# Patient Record
Sex: Female | Born: 1950 | Race: White | Hispanic: No | Marital: Single | Smoking: Never smoker
Health system: Southern US, Community
[De-identification: ages and names within clinical notes are randomized; demographics above are authoritative.]

## PROBLEM LIST (undated history)

## (undated) ENCOUNTER — Emergency Department: Payer: Medicare Other

## (undated) DIAGNOSIS — Z8742 Personal history of other diseases of the female genital tract: Secondary | ICD-10-CM

## (undated) DIAGNOSIS — K219 Gastro-esophageal reflux disease without esophagitis: Secondary | ICD-10-CM

## (undated) DIAGNOSIS — M1990 Unspecified osteoarthritis, unspecified site: Secondary | ICD-10-CM

## (undated) DIAGNOSIS — F32A Depression, unspecified: Secondary | ICD-10-CM

## (undated) DIAGNOSIS — G2581 Restless legs syndrome: Secondary | ICD-10-CM

## (undated) HISTORY — PX: FRACTURE SURGERY: SHX138

## (undated) HISTORY — PX: SHOULDER SURGERY: SHX246

---

## 2006-01-18 ENCOUNTER — Ambulatory Visit: Payer: Self-pay | Admitting: Internal Medicine

## 2008-01-30 ENCOUNTER — Ambulatory Visit: Payer: Self-pay | Admitting: Internal Medicine

## 2008-07-31 ENCOUNTER — Ambulatory Visit: Payer: Self-pay | Admitting: Unknown Physician Specialty

## 2010-03-17 ENCOUNTER — Ambulatory Visit: Payer: Self-pay | Admitting: Internal Medicine

## 2010-03-27 ENCOUNTER — Ambulatory Visit: Payer: Self-pay

## 2010-04-18 ENCOUNTER — Other Ambulatory Visit: Payer: Self-pay | Admitting: Internal Medicine

## 2011-04-05 ENCOUNTER — Emergency Department: Payer: Self-pay | Admitting: Emergency Medicine

## 2011-04-14 ENCOUNTER — Ambulatory Visit: Payer: Self-pay | Admitting: Internal Medicine

## 2014-07-23 ENCOUNTER — Ambulatory Visit: Payer: Self-pay

## 2014-08-07 ENCOUNTER — Ambulatory Visit: Payer: Self-pay | Admitting: Family Medicine

## 2015-07-23 ENCOUNTER — Other Ambulatory Visit: Payer: Self-pay | Admitting: Internal Medicine

## 2015-07-23 ENCOUNTER — Ambulatory Visit
Admission: RE | Admit: 2015-07-23 | Discharge: 2015-07-23 | Disposition: A | Discharge status: Home / Self Care | Payer: BLUE CROSS/BLUE SHIELD | Source: Ambulatory Visit | Attending: Internal Medicine | Admitting: Internal Medicine

## 2015-07-23 DIAGNOSIS — Z1231 Encounter for screening mammogram for malignant neoplasm of breast: Secondary | ICD-10-CM | POA: Diagnosis present

## 2015-07-24 ENCOUNTER — Other Ambulatory Visit: Payer: Self-pay | Admitting: Internal Medicine

## 2015-07-24 DIAGNOSIS — Z1231 Encounter for screening mammogram for malignant neoplasm of breast: Secondary | ICD-10-CM

## 2017-07-29 ENCOUNTER — Other Ambulatory Visit: Payer: Self-pay | Admitting: Internal Medicine

## 2017-07-29 DIAGNOSIS — Z1239 Encounter for other screening for malignant neoplasm of breast: Secondary | ICD-10-CM

## 2017-07-29 DIAGNOSIS — Z0000 Encounter for general adult medical examination without abnormal findings: Secondary | ICD-10-CM

## 2017-08-10 ENCOUNTER — Ambulatory Visit: Payer: BLUE CROSS/BLUE SHIELD

## 2018-08-02 ENCOUNTER — Other Ambulatory Visit: Payer: Self-pay | Admitting: Internal Medicine

## 2018-08-02 DIAGNOSIS — Z1231 Encounter for screening mammogram for malignant neoplasm of breast: Secondary | ICD-10-CM

## 2018-11-12 ENCOUNTER — Ambulatory Visit
Admission: EM | Admit: 2018-11-12 | Discharge: 2018-11-12 | Disposition: A | Discharge status: Home / Self Care | Payer: Medicare Other | Attending: Emergency Medicine | Admitting: Emergency Medicine

## 2018-11-12 ENCOUNTER — Encounter: Payer: Self-pay | Admitting: Emergency Medicine

## 2018-11-12 ENCOUNTER — Other Ambulatory Visit: Payer: Self-pay

## 2018-11-12 ENCOUNTER — Ambulatory Visit: Payer: Medicare Other

## 2018-11-12 DIAGNOSIS — Y92007 Garden or yard of unspecified non-institutional (private) residence as the place of occurrence of the external cause: Secondary | ICD-10-CM | POA: Diagnosis not present

## 2018-11-12 DIAGNOSIS — W010XXA Fall on same level from slipping, tripping and stumbling without subsequent striking against object, initial encounter: Secondary | ICD-10-CM | POA: Diagnosis not present

## 2018-11-12 DIAGNOSIS — M25531 Pain in right wrist: Secondary | ICD-10-CM | POA: Insufficient documentation

## 2018-11-12 HISTORY — DX: Gastro-esophageal reflux disease without esophagitis: K21.9

## 2018-11-12 NOTE — Discharge Instructions (Signed)
Over-the-counter Tylenol or ibuprofen as needed.  Ice and elevate.  Wear splint for the next 3 to 4 days and gradual increase activity as tolerated.  Follow-up with orthopedic in 1 week as needed for continued pain.  Follow up with your primary care physician this week as needed. Return to Urgent care for new or worsening concerns.

## 2018-11-12 NOTE — ED Triage Notes (Signed)
Patient c/o right wrist pain after she fell and landed on her right wrist today.

## 2018-11-12 NOTE — ED Provider Notes (Signed)
[facility] ____________________________________________  Time seen: Approximately 4:06 PM  I have reviewed the triage vital signs and the nursing notes.   HISTORY  Chief Complaint Wrist Pain (right)   HPI Meredith Norris is a 68 y.o. female presenting for evaluation of right wrist pain after injury that occurred this afternoon.  Patient states she was working in her yard and pulling a [employer] line out of a bush.  States while she was pulling hard the binding broke way causing her to fall back.  States caught her self with her right wrist.  States that she felt the pain in her wrist more during the pool injury than the fall.  Denies head injury or other injuries.  Right-hand-dominant.  Has applied ice since.  Denies other alleviating measures.  Pain worse with activity.  Denies recent cough, chest pain, shortness of breath or fevers.  Reports otherwise doing well.  Mickey Farberhies, David, MD: PCP    Past Medical History:  Diagnosis Date  . GERD (gastroesophageal reflux disease)     There are no active problems to display for this patient.   Past Surgical History:  Procedure Laterality Date  . FRACTURE SURGERY    . SHOULDER SURGERY Left      No current facility-administered medications for this encounter.   Current Outpatient Medications:  .  fluticasone (FLONASE) 50 MCG/ACT nasal spray, Place into the nose., Disp: , Rfl:  .  pantoprazole (PROTONIX) 40 MG tablet, Take by mouth., Disp: , Rfl:  .  sertraline (ZOLOFT) 100 MG tablet, Take by mouth., Disp: , Rfl:  .  Cholecalciferol (VITAMIN D-1000 MAX ST) 25 MCG (1000 UT) tablet, Take by mouth., Disp: , Rfl:  .  Glucosamine-Chondroit-Vit C-Mn (GLUCOSAMINE CHONDROITIN COMPLX) TABS, Take by mouth., Disp: , Rfl:  .  Omega-3 1000 MG CAPS, Take by mouth., Disp: , Rfl:  .  vitamin B-12 (CYANOCOBALAMIN) 1000 MCG tablet, Take by mouth., Disp: , Rfl:   Allergies Patient has no known allergies.  Family History  Problem  Relation Age of Onset  . Breast cancer Cousin        mat cousin  . Hypertension Mother     Social History Social History   Tobacco Use  . Smoking status: Never Smoker  . Smokeless tobacco: Never Used  Substance Use Topics  . Alcohol use: Never    Frequency: Never  . Drug use: Never    Review of Systems Constitutional: No fever Cardiovascular: Denies chest pain. Respiratory: Denies shortness of breath. Gastrointestinal: No abdominal pain.   Musculoskeletal:Positive right wrist pain.  Skin: Negative for rash.   ____________________________________________   PHYSICAL EXAM:  VITAL SIGNS: ED Triage Vitals  Enc Vitals Group     BP 11/12/18 1608 131/72     Pulse Rate 11/12/18 1608 76     Resp 11/12/18 1608 16     Temp 11/12/18 1608 98.1 F (36.7 C)     Temp Source 11/12/18 1608 Oral     SpO2 11/12/18 1608 99 %     Weight 11/12/18 1607 150 lb (68 kg)     Height 11/12/18 1607 5\' 2"  (1.575 m)     Head Circumference --      Peak Flow --      Pain Score 11/12/18 1606 5     Pain Loc --      Pain Edu? --      Excl. in GC? --     Constitutional: Alert and oriented. Well appearing and in no  acute distress. ENT      Head: Normocephalic and atraumatic. Cardiovascular: Normal rate, regular rhythm. Grossly normal heart sounds.  Good peripheral circulation. Respiratory: Normal respiratory effort without tachypnea nor retractions. Breath sounds are clear and equal bilaterally. No wheezes, rales, rhonchi. Musculoskeletal: Steady gait. Bilateral distal radial pulses equal and easily palpated.  Right hand grip strong. Except: Right distal radius tenderness to direct palpation with minimal ecchymosis, mild pain with handgrip full hand grip and good strength, right hand no motor or tendon deficit noted to distal fingers, mild pain with wrist rotation but full range of motion present.  Right upper extremity otherwise nontender. Neurologic:  Normal speech and language.  Skin:  Skin is  warm, dry and intact. No rash noted. Psychiatric: Mood and affect are normal. Speech and behavior are normal. Patient exhibits appropriate insight and judgment   ___________________________________________   LABS (all labs ordered are listed, but only abnormal results are displayed)  Labs Reviewed - No data to display ____________________________________________  RADIOLOGY  Dg Wrist Complete Right  Result Date: 11/12/2018 CLINICAL DATA:  Right wrist pain.  Status post fall. EXAM: RIGHT WRIST - COMPLETE 3+ VIEW COMPARISON:  None. FINDINGS: Generalized osteopenia. No acute fracture or dislocation. No aggressive osseous lesion. 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 No soft tissue abnormality. IMPRESSION: No acute osseous injury of the right wrist. Electronically Signed   By: Elige Ko   On: 11/12/2018 16:28   ____________________________________________   PROCEDURES Procedures     INITIAL IMPRESSION / ASSESSMENT AND PLAN / ED COURSE  Pertinent labs & imaging results that were available during my care of the patient were reviewed by me and considered in my medical decision making (see chart for details).  Well-appearing patient.  No acute distress.  Right wrist pain post mechanical injury that occurred prior to arrival.  Right wrist x-ray as above per radiologist, no acute osseous abnormality.  Suspect sprain and contusion injuries.  Right Velcro cock up splint given for support to use for the next 3 to 4 days.  Ice, over-the-counter Tylenol ibuprofen as needed.  Follow-up with orthopedic in 1 week as needed for continued pain.  Discussed follow up with Primary care physician this week. Discussed follow up and return parameters including no resolution or any worsening concerns. Patient verbalized understanding and agreed to plan.   ____________________________________________   FINAL CLINICAL IMPRESSION(S) / ED DIAGNOSES  Final diagnoses:  Right wrist  pain     ED Discharge Orders    None       Note: This dictation was prepared with Dragon dictation along with smaller phrase technology. Any transcriptional errors that result from this process are unintentional.         Renford Dills, NP 11/12/18 1714

## 2020-12-27 ENCOUNTER — Ambulatory Visit (INDEPENDENT_AMBULATORY_CARE_PROVIDER_SITE_OTHER): Payer: Medicare Other

## 2020-12-27 ENCOUNTER — Other Ambulatory Visit: Payer: Self-pay

## 2020-12-27 ENCOUNTER — Ambulatory Visit
Admission: EM | Admit: 2020-12-27 | Discharge: 2020-12-27 | Disposition: A | Discharge status: Home / Self Care | Payer: Medicare Other | Attending: Medical Oncology | Admitting: Medical Oncology

## 2020-12-27 ENCOUNTER — Encounter: Payer: Self-pay | Admitting: Emergency Medicine

## 2020-12-27 DIAGNOSIS — M545 Low back pain, unspecified: Secondary | ICD-10-CM | POA: Diagnosis present

## 2020-12-27 DIAGNOSIS — R3129 Other microscopic hematuria: Secondary | ICD-10-CM

## 2020-12-27 DIAGNOSIS — R109 Unspecified abdominal pain: Secondary | ICD-10-CM

## 2020-12-27 DIAGNOSIS — R319 Hematuria, unspecified: Secondary | ICD-10-CM

## 2020-12-27 LAB — POCT URINALYSIS DIP (DEVICE)
Bilirubin Urine: NEGATIVE
Glucose, UA: NEGATIVE mg/dL
Ketones, ur: NEGATIVE mg/dL
Leukocytes,Ua: NEGATIVE
Nitrite: NEGATIVE
Protein, ur: NEGATIVE mg/dL
Specific Gravity, Urine: <=1.005 (ref 1.005–1.030)
Urobilinogen, UA: 0.2 mg/dL (ref 0.0–1.0)
pH: 5.5 (ref 5.0–8.0)

## 2020-12-27 NOTE — Discharge Instructions (Addendum)
The urine shows microscopic blood.  We x-rayed you to see if there is any evidence of a kidney stone and there was not any stone seen on the x-ray but as we discussed, they do not see all kidney stones so if your symptoms continue you may follow-up with your primary care provider to see about ordering a CT.  It is most likely though that this is musculoskeletal so I would continue using your device and taking Tylenol/Aleve for pain and also try using warm compresses.  If you have any severe acute worsening of your pain you need to go to the ER.  BACK PAIN: Stressed avoiding painful activities . RICE (REST, ICE, COMPRESSION, ELEVATION) guidelines reviewed. May alternate ice and heat. Consider use of muscle rubs, Salonpas patches, etc. Use medications as directed including muscle relaxers if prescribed. Take anti-inflammatory medications as prescribed or OTC NSAIDs/Tylenol.  F/u with PCP in 7-10 days for reexamination, and please feel free to call or return to the urgent care at any time for any questions or concerns you may have and we will be happy to help you!   BACK PAIN RED FLAGS: If the back pain acutely worsens or there are any red flag symptoms such as numbness/tingling, leg weakness, saddle anesthesia, or loss of bowel/bladder control, go immediately to the ER. Follow up with [place] as scheduled or sooner if the pain does not begin to resolve or if it worsens before the follow up

## 2020-12-27 NOTE — ED Provider Notes (Signed)
[facility]    CSN: [id number] Arrival date & time: 12/27/20  1654      History   Chief Complaint Chief Complaint  Patient presents with   Back Pain    HPI Rheda Kassab is a 70 y.o. female presenting for approximately 1 week history of left lower back pain.  Patient states that it is constant and worse at times.  Pain is worse never she is sitting and improves a little bit when she lies down or stands up.  It does not radiate to the lower extremities and is not associate with any numbness, weakness or tingling.  She denies any sort of injury.  Patient does admit concerns for possible kidney infection since she states that she has had a kidney infection before.  She does however deny any fever, fatigue, chills, dysuria, urinary frequency urgency or hematuria.  She has tried over-the-counter NSAIDs, Tylenol and a pain relieving device similar to a TENS unit.  Patient states the TENS unit helps but the other medications do not.  No other complaints.  HPI  Past Medical History:  Diagnosis Date   GERD (gastroesophageal reflux disease)     There are no problems to display for this patient.   Past Surgical History:  Procedure Laterality Date   FRACTURE SURGERY     SHOULDER SURGERY Left     OB History   No obstetric history on file.      Home Medications    Prior to Admission medications   Medication Sig Start Date End Date Taking? Authorizing Provider  Cholecalciferol 25 MCG (1000 UT) tablet Take by mouth.   Yes [provider]  fluticasone (FLONASE) 50 MCG/ACT nasal spray Place into the nose. 08/02/18  Yes [provider]  Glucosamine-Chondroit-Vit C-Mn (GLUCOSAMINE CHONDROITIN COMPLX) TABS Take by mouth.   Yes [provider]  Omega-3 1000 MG CAPS Take by mouth.   Yes [provider]  pantoprazole (PROTONIX) 40 MG tablet Take by mouth. 08/02/18  Yes [provider]  sertraline (ZOLOFT) 100 MG tablet Take by  mouth. 08/02/18  Yes [provider]  vitamin B-12 (CYANOCOBALAMIN) 1000 MCG tablet Take by mouth.   Yes [provider]  vitamin E 180 MG (400 UNITS) capsule Take by mouth.   Yes [provider]    Family History Family History  Problem Relation Age of Onset   Breast cancer Cousin        mat cousin   Hypertension Mother     Social History Social History   Tobacco Use   Smoking status: Never   Smokeless tobacco: Never  Vaping Use   Vaping Use: Never used  Substance Use Topics   Alcohol use: Never   Drug use: Never     Allergies   Ciprofloxacin hcl, Clindamycin, and Triamcinolone acetonide   Review of Systems Review of Systems  Constitutional:  Negative for fatigue and fever.  Gastrointestinal:  Negative for abdominal pain, nausea and vomiting.  Genitourinary:  Negative for difficulty urinating, dysuria, flank pain, frequency and hematuria.  Musculoskeletal:  Positive for back pain.  Skin:  Negative for rash.  Neurological:  Negative for weakness and numbness.    Physical Exam Triage Vital Signs ED Triage Vitals  Enc Vitals Group     BP 12/27/20 1726 (!) 154/79     Pulse Rate 12/27/20 1726 79     Resp 12/27/20 1726 18     Temp 12/27/20 1726 (!) 97.5 F (36.4 C)  Temp Source 12/27/20 1726 Oral     SpO2 12/27/20 1726 98 %     Weight 12/27/20 1723 149 lb 14.6 oz (68 kg)     Height 12/27/20 1723 5\' 2"  (1.575 m)     Head Circumference --      Peak Flow --      Pain Score 12/27/20 1722 5     Pain Loc --      Pain Edu? --      Excl. in GC? --    No data found.  Updated Vital Signs BP (!) 154/79 (BP Location: Left Arm)   Pulse 79   Temp (!) 97.5 F (36.4 C) (Oral)   Resp 18   Ht 5\' 2"  (1.575 m)   Wt 149 lb 14.6 oz (68 kg)   SpO2 98%   BMI 27.42 kg/m      Physical Exam Vitals and nursing note reviewed.  Constitutional:      General: She is not in acute distress.    Appearance: Normal appearance. She is not  ill-appearing or toxic-appearing.  HENT:     Head: Normocephalic and atraumatic.     Nose: Nose normal.     Mouth/Throat:     Mouth: Mucous membranes are moist.     Pharynx: Oropharynx is clear.  Eyes:     General: No scleral icterus.       Right eye: No discharge.        Left eye: No discharge.     Conjunctiva/sclera: Conjunctivae normal.  Cardiovascular:     Rate and Rhythm: Normal rate and regular rhythm.     Heart sounds: Normal heart sounds.  Pulmonary:     Effort: Pulmonary effort is normal. No respiratory distress.     Breath sounds: Normal breath sounds.  Abdominal:     Palpations: Abdomen is soft.     Tenderness: There is no abdominal tenderness. There is left CVA tenderness (mild).  Musculoskeletal:     Cervical back: Neck supple.     Comments: Back: Tenderness to palpation of the left paralumbar muscles.  No spinal tenderness.  Full range of motion of back.  She does have some discomfort with full extension.  5 out of 5 strength bilateral lower extremities.  Negative straight leg bilaterally  Skin:    General: Skin is dry.  Neurological:     General: No focal deficit present.     Mental Status: She is alert. Mental status is at baseline.     Motor: No weakness.     Gait: Gait normal.  Psychiatric:        Mood and Affect: Mood normal.        Behavior: Behavior normal.        Thought Content: Thought content normal.     UC Treatments / Results  Labs (all labs ordered are listed, but only abnormal results are displayed) Labs Reviewed  POCT URINALYSIS DIP (DEVICE) - Abnormal; Notable for the following components:      Result Value   Hgb urine dipstick TRACE (*)    All other components within normal limits  URINE CULTURE  URINALYSIS, ROUTINE W REFLEX MICROSCOPIC    EKG   Radiology DG Abdomen 1 View  Result Date: 12/27/2020 CLINICAL DATA:  Left flank pain with hematuria EXAM: ABDOMEN - 1 VIEW COMPARISON:  None. FINDINGS: Nonobstructed gas pattern with large  amount of stool in the colon. No radiopaque calculi over the kidneys. IMPRESSION: Negative.  Large amount of stool  in the colon. Electronically Signed   By: Jasmine Pang M.D.   On: 12/27/2020 18:39    Procedures Procedures (including critical care time)  Medications Ordered in UC Medications - No data to display  Initial Impression / Assessment and Plan / UC Course  I have reviewed the triage vital signs and the nursing notes.  Pertinent labs & imaging results that were available during my care of the patient were reviewed by me and considered in my medical decision making (see chart for details).  70 year old female presenting for left-sided lower back pain x1 week.  Pain worse with sitting.  Pain is improved by use of TENS unit.  Concern for possible kidney infection.  BP is little elevated at 154/79.  Patient is overall well-appearing and she does have tenderness of the left paralumbar region and questionable mild left CVA tenderness.  Urinalysis does show trace hemoglobin.  No concern for infection.  KUB ordered to assess for possible renal stone.  KUB does not not show any stone but does show large amount of stool burden.  Reviewed this with patient.  Advised this is likely musculoskeletal but renal stone cannot be ruled out given the flank pain and hematuria.  Advised patient if symptoms continues she may need to see her PCP where they can consider ordering a CT stone protocol to assess for possible renal stone.  I did offer her a different anti-inflammatory medication and a muscle relaxer but she declines.  Reviewed ED precautions for back pain with patient.  Final Clinical Impressions(s) / UC Diagnoses   Final diagnoses:  Acute left-sided low back pain without sciatica  Other microscopic hematuria     Discharge Instructions      The urine shows microscopic blood.  We x-rayed you to see if there is any evidence of a kidney stone and there was not any stone seen on the  x-ray but as we discussed, they do not see all kidney stones so if your symptoms continue you may follow-up with your primary care provider to see about ordering a CT.  It is most likely though that this is musculoskeletal so I would continue using your device and taking Tylenol/Aleve for pain and also try using warm compresses.  If you have any severe acute worsening of your pain you need to go to the ER.  BACK PAIN: Stressed avoiding painful activities . RICE (REST, ICE, COMPRESSION, ELEVATION) guidelines reviewed. May alternate ice and heat. Consider use of muscle rubs, Salonpas patches, etc. Use medications as directed including muscle relaxers if prescribed. Take anti-inflammatory medications as prescribed or OTC NSAIDs/Tylenol.  F/u with PCP in 7-10 days for reexamination, and please feel free to call or return to the urgent care at any time for any questions or concerns you may have and we will be happy to help you!   BACK PAIN RED FLAGS: If the back pain acutely worsens or there are any red flag symptoms such as numbness/tingling, leg weakness, saddle anesthesia, or loss of bowel/bladder control, go immediately to the ER. Follow up with [place] as scheduled or sooner if the pain does not begin to resolve or if it worsens before the follow up       ED Prescriptions   None    PDMP not reviewed this encounter.   Shirlee Latch, PA-C 12/27/20 [phone]

## 2020-12-27 NOTE — ED Triage Notes (Signed)
Pt c/o left sided lower back pain. Started about a week ago. She denies dysuria, fever. Denies injury.

## 2020-12-29 LAB — URINE CULTURE: Culture: <10000 — AB

## 2021-01-22 ENCOUNTER — Emergency Department: Payer: Medicare Other

## 2021-01-22 ENCOUNTER — Other Ambulatory Visit: Payer: Self-pay

## 2021-01-22 ENCOUNTER — Encounter: Payer: Self-pay | Admitting: Emergency Medicine

## 2021-01-22 ENCOUNTER — Emergency Department
Admission: EM | Admit: 2021-01-22 | Discharge: 2021-01-22 | Disposition: A | Discharge status: Home / Self Care | Payer: Medicare Other | Attending: Emergency Medicine | Admitting: Emergency Medicine

## 2021-01-22 DIAGNOSIS — K381 Appendicular concretions: Secondary | ICD-10-CM | POA: Diagnosis not present

## 2021-01-22 DIAGNOSIS — K5730 Diverticulosis of large intestine without perforation or abscess without bleeding: Secondary | ICD-10-CM | POA: Diagnosis not present

## 2021-01-22 DIAGNOSIS — R109 Unspecified abdominal pain: Secondary | ICD-10-CM | POA: Diagnosis present

## 2021-01-22 DIAGNOSIS — K5901 Slow transit constipation: Secondary | ICD-10-CM | POA: Insufficient documentation

## 2021-01-22 DIAGNOSIS — K389 Disease of appendix, unspecified: Secondary | ICD-10-CM

## 2021-01-22 DIAGNOSIS — K5710 Diverticulosis of small intestine without perforation or abscess without bleeding: Secondary | ICD-10-CM

## 2021-01-22 LAB — CBC
HCT: 36.9 % (ref 36.0–46.0)
Hemoglobin: 12.8 g/dL (ref 12.0–15.0)
MCH: 30.9 pg (ref 26.0–34.0)
MCHC: 34.7 g/dL (ref 30.0–36.0)
MCV: 89.1 fL (ref 80.0–100.0)
Platelets: 296 10*3/uL (ref 150–400)
RBC: 4.14 MIL/uL (ref 3.87–5.11)
RDW: 13.2 % (ref 11.5–15.5)
WBC: 5.9 10*3/uL (ref 4.0–10.5)
nRBC: 0 % (ref 0.0–0.2)

## 2021-01-22 LAB — COMPREHENSIVE METABOLIC PANEL
ALT: 15 U/L (ref 0–44)
AST: 22 U/L (ref 15–41)
Albumin: 3.6 g/dL (ref 3.5–5.0)
Alkaline Phosphatase: 90 U/L (ref 38–126)
Anion gap: 9 (ref 5–15)
BUN: 8 mg/dL (ref 8–23)
CO2: 23 mmol/L (ref 22–32)
Calcium: 9.2 mg/dL (ref 8.9–10.3)
Chloride: 101 mmol/L (ref 98–111)
Creatinine, Ser: 0.72 mg/dL (ref 0.44–1.00)
GFR, Estimated: >60 mL/min (ref >60)
Glucose, Bld: 97 mg/dL (ref 70–99)
Potassium: 4.1 mmol/L (ref 3.5–5.1)
Sodium: 133 mmol/L — ABNORMAL LOW (ref 135–145)
Total Bilirubin: 0.6 mg/dL (ref 0.3–1.2)
Total Protein: 6.8 g/dL (ref 6.5–8.1)

## 2021-01-22 LAB — URINALYSIS, COMPLETE (UACMP) WITH MICROSCOPIC
Bacteria, UA: NONE SEEN
Bilirubin Urine: NEGATIVE
Glucose, UA: NEGATIVE mg/dL
Hgb urine dipstick: NEGATIVE
Ketones, ur: NEGATIVE mg/dL
Leukocytes,Ua: NEGATIVE
Nitrite: NEGATIVE
Protein, ur: NEGATIVE mg/dL
Specific Gravity, Urine: 1.002 — ABNORMAL LOW (ref 1.005–1.030)
Squamous Epithelial / HPF: NONE SEEN (ref 0–5)
WBC, UA: NONE SEEN WBC/hpf (ref 0–5)
pH: 7 (ref 5.0–8.0)

## 2021-01-22 LAB — LIPASE, BLOOD: Lipase: 42 U/L (ref 11–51)

## 2021-01-22 MED ORDER — LACTULOSE 10 GM/15ML PO SOLN: ORAL | 0 refills | Status: AC

## 2021-01-22 MED ORDER — DOCUSATE SODIUM 250 MG PO CAPS: 250.0000 mg | ORAL_CAPSULE | Freq: Every day | ORAL | 0 refills | Status: AC

## 2021-01-22 NOTE — ED Triage Notes (Signed)
Pt to ED via POV. Pt seen at urgent care on 7/8 for constipation. Pt states that she has still not had a normal bowel movement since then. Pt states that she is taking OTC medications without relief. Pt states that she is able to pass stool but that it is either a really small amount or it is loose stool. Pt states that she is concerned that she has a bowel blockage. Pt is able to pass gas. Pt is in NAD.

## 2021-01-22 NOTE — Discharge Instructions (Addendum)
Take the stool softener daily, to prevent constipation.  You can start this now.  With the lactulose, I would take this up to 3 times daily until bowel movements are soft/liquid, then cut back.  Repeat as needed.  Follow-up with your GI physician.  You should discuss your CT results to see if they want to do an upper endoscopy as well.  Continue your prescribed antacid.  You could increase the dose to twice a day if you continue to have reflux.  You do have a hiatal hernia noted on your scan.

## 2021-01-22 NOTE — ED Notes (Signed)
First Nurse Note: Pt to ED via POV for constipation. Pt is in NAD.

## 2021-01-22 NOTE — ED Notes (Signed)
Patient called and says they do not have 250 mg colace as ordered in prescription.  Spoke with Dr. Erma Heritage and he tried to change to 100 mg dose and send to [facility] but could not.  I just called in rx for ducosate 100 mg once daily with quantity of 30 and no refills.

## 2021-01-22 NOTE — ED Provider Notes (Signed)
[facility] Emergency Department Provider Note  ____________________________________________   Event Date/Time   First MD Initiated Contact with Patient 01/22/21 1042     (approximate)  I have reviewed the triage vital signs and the nursing notes.   HISTORY  Chief Complaint Constipation    HPI Meredith Norris is a 70 y.o. female with past medical history of GERD here with abdominal pain and intermittent distention.  Patient states that for the last several weeks, she has had difficulty having regular bowel movements.  She had some left flank pain at the beginning of July and was seen in urgent care at that time.  She was told that she was constipated, and was told to take a bowel regimen.  Since then, she feels like she is been having difficulty going to the bathroom.  She has had decreased bowel movement frequency as well as sensation that she needed to go but could not produce more than 1 or 2 small balls of stool.  She has felt distended intermittently but not persistently.  She said no nausea or vomiting.  She has had some decreased appetite.  Denies any weight changes.  No night sweats.  Denies any blood in her stools.  She has been taking MiraLAX intermittently as well as increasing fiber in her diet without significant relief.  No specific worsening factors.  No history of constipation.  She is due for colonoscopy in September.  Denies history of abnormal colonoscopies.  Denies changes in her stool prior to the last month.    Past Medical History:  Diagnosis Date   GERD (gastroesophageal reflux disease)     There are no problems to display for this patient.   Past Surgical History:  Procedure Laterality Date   FRACTURE SURGERY     SHOULDER SURGERY Left     Prior to Admission medications   Medication Sig Start Date End Date Taking? Authorizing Provider  docusate sodium (COLACE) 250 MG capsule Take 1 capsule (250 mg total) by mouth daily.  01/22/21 02/21/21 Yes Shaune Pollack, MD  lactulose (CHRONULAC) 10 GM/15ML solution Take 30 mL by mouth up to three times daily for constipation. Start with one dose, repeat up to 2 additional times daily (every 6-8 hr) until bowel movements are soft/liquid, then cut back. Repeat as needed 01/22/21  Yes Shaune Pollack, MD  Cholecalciferol 25 MCG (1000 UT) tablet Take by mouth.    [provider]  fluticasone (FLONASE) 50 MCG/ACT nasal spray Place into the nose. 08/02/18   [provider]  Glucosamine-Chondroit-Vit C-Mn (GLUCOSAMINE CHONDROITIN COMPLX) TABS Take by mouth.    [provider]  Omega-3 1000 MG CAPS Take by mouth.    [provider]  pantoprazole (PROTONIX) 40 MG tablet Take by mouth. 08/02/18   [provider]  sertraline (ZOLOFT) 100 MG tablet Take by mouth. 08/02/18   [provider]  vitamin B-12 (CYANOCOBALAMIN) 1000 MCG tablet Take by mouth.    [provider]  vitamin E 180 MG (400 UNITS) capsule Take by mouth.    [provider]    Allergies Ciprofloxacin hcl, Clindamycin, and Triamcinolone acetonide  Family History  Problem Relation Age of Onset   Breast cancer Cousin        mat cousin   Hypertension Mother     Social History Social History   Tobacco Use   Smoking status: Never   Smokeless tobacco: Never  Vaping Use   Vaping Use: Never used  Substance  Use Topics   Alcohol use: Never   Drug use: Never    Review of Systems  Review of Systems  Constitutional:  Positive for fatigue. Negative for fever.  HENT:  Negative for congestion and sore throat.   Eyes:  Negative for visual disturbance.  Respiratory:  Negative for cough and shortness of breath.   Cardiovascular:  Negative for chest pain.  Gastrointestinal:  Positive for abdominal distention and constipation. Negative for abdominal pain, diarrhea, nausea and vomiting.  Genitourinary:  Negative for flank pain.  Musculoskeletal:  Negative  for back pain and neck pain.  Skin:  Negative for rash and wound.  Neurological:  Negative for weakness.  All other systems reviewed and are negative.   ____________________________________________  PHYSICAL EXAM:      VITAL SIGNS: ED Triage Vitals  Enc Vitals Group     BP 01/22/21 0954 136/78     Pulse Rate 01/22/21 0954 69     Resp 01/22/21 0954 18     Temp 01/22/21 0954 98.1 F (36.7 C)     Temp Source 01/22/21 0954 Oral     SpO2 01/22/21 0954 97 %     Weight 01/22/21 0955 115 lb (52.2 kg)     Height 01/22/21 0955 5\' 2"  (1.575 m)     Head Circumference --      Peak Flow --      Pain Score 01/22/21 0958 0     Pain Loc --      Pain Edu? --      Excl. in GC? --      Physical Exam Vitals and nursing note reviewed.  Constitutional:      General: She is not in acute distress.    Appearance: She is well-developed.  HENT:     Head: Normocephalic and atraumatic.  Eyes:     Conjunctiva/sclera: Conjunctivae normal.  Cardiovascular:     Rate and Rhythm: Normal rate and regular rhythm.     Heart sounds: Normal heart sounds. No murmur heard.   No friction rub.  Pulmonary:     Effort: Pulmonary effort is normal. No respiratory distress.     Breath sounds: Normal breath sounds. No wheezing or rales.  Abdominal:     General: Abdomen is flat. Bowel sounds are normal. There is no distension.     Palpations: Abdomen is soft.     Tenderness: There is no abdominal tenderness.  Musculoskeletal:     Cervical back: Neck supple.  Skin:    General: Skin is warm.     Capillary Refill: Capillary refill takes less than 2 seconds.  Neurological:     Mental Status: She is alert and oriented to person, place, and time.     Motor: No abnormal muscle tone.      ____________________________________________   LABS (all labs ordered are listed, but only abnormal results are displayed)  Labs Reviewed  COMPREHENSIVE METABOLIC PANEL - Abnormal; Notable for the following components:       Result Value   Sodium 133 (*)    All other components within normal limits  URINALYSIS, COMPLETE (UACMP) WITH MICROSCOPIC - Abnormal; Notable for the following components:   Color, Urine YELLOW (*)    APPearance HAZY (*)    Specific Gravity, Urine 1.002 (*)    All other components within normal limits  LIPASE, BLOOD  CBC    ____________________________________________  EKG:  ________________________________________  RADIOLOGY All imaging, including plain films, CT scans, and ultrasounds, independently reviewed by me, and  interpretations confirmed via formal radiology reads.  ED MD interpretation:   CT abdomen/pelvis: No acute abnormality, probable small hiatal hernia, moderate stool burden, large duodenal diverticulum  Official radiology report(s): CT ABDOMEN PELVIS WO CONTRAST  Result Date: 01/22/2021 CLINICAL DATA:  Abdominal distension, abdominal pain. EXAM: CT ABDOMEN AND PELVIS WITHOUT CONTRAST TECHNIQUE: Multidetector CT imaging of the abdomen and pelvis was performed following the standard protocol without IV contrast. COMPARISON:  None. FINDINGS: Lower chest: Lung bases are clear. Hepatobiliary: Tiny hypodensity at the hepatic dome is likely an incidental finding. No suspicious hepatic lesions. Normal appearance of the gallbladder. Pancreas: Unremarkable. No pancreatic ductal dilatation or surrounding inflammatory changes. Spleen: Normal in size without focal abnormality. Adrenals/Urinary Tract: Normal adrenal glands. Horseshoe kidney configuration. Mild fullness of the right renal pelvis. No significant hydronephrosis. Mild fullness of the left renal pelvis. No definite renal calculi. Normal appearance of the urinary bladder. Stomach/Bowel: Colonic diverticula without acute inflammation. Elongated hyperdense structure in the right lower quadrant of the abdomen measures up to 2.1 cm. Etiology of this dense structure is uncertain but could represent for an appendicolith. Moderate  amount of stool in the colon. Wall thickening involving the distal esophagus. Probable small hiatal hernia. Large air-fluid level near the pancreatic head and most compatible with a large duodenal diverticulum measuring to 3.9 cm. Large calcification adjacent to this duodenal diverticulum that measures up to 1.2 cm. No evidence for bowel obstruction. Vascular/Lymphatic: Atherosclerotic calcifications in the abdominal aorta without aneurysm. No lymph node enlargement in the abdomen or pelvis. Reproductive: Limited evaluation but uterus appears to be present. No evidence for an adnexal mass. Other: Negative for free air. Negative for ascites. Tiny umbilical hernia containing fat. Musculoskeletal: Degenerative facet disease in the lumbar spine. No acute bone abnormality. IMPRESSION: 1. No acute abnormality in the abdomen or pelvis. 2. Probable small hiatal hernia with mild wall thickening in the distal esophagus. Esophagitis cannot be excluded. 3. Moderate stool burden. Prominent hyperdense or calcified structure in the right lower quadrant of the abdomen. Not clear if this is within small bowel loop or related to an appendicolith. This area is poorly characterized. 4. 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 Large calcification adjacent to the duodenum diverticulum. Exact location of this upper abdominal calcification is uncertain. No significant biliary dilatation but limited evaluation on this study without intravascular contrast. 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 6.  Aortic Atherosclerosis (ICD10-I70.0). Electronically Signed   By: Richarda Overlie M.D.   On: 01/22/2021 12:17    ____________________________________________  PROCEDURES   Procedure(s) performed (including Critical Care):  Procedures  ____________________________________________  INITIAL IMPRESSION / MDM / ASSESSMENT AND PLAN / ED COURSE  As part of my medical decision making, I reviewed the following data within  the electronic MEDICAL RECORD NUMBER Nursing notes reviewed and incorporated, Old chart reviewed, Notes from prior ED visits, and [place] Controlled Substance Database       *Jadi Deyarmin was evaluated in Emergency Department on 01/22/2021 for the symptoms described in the history of present illness. She was evaluated in the context of the global COVID-19 pandemic, which necessitated consideration that the patient might be at risk for infection with the SARS-CoV-2 virus that causes COVID-19. Institutional protocols and algorithms that pertain to the evaluation of patients at risk for COVID-19 are in a state of rapid change based on information released by regulatory bodies including the CDC and federal and state organizations. These policies and algorithms were followed during the patient's care in  the ED.  Some ED evaluations and interventions may be delayed as a result of limited staffing during the pandemic.*     Medical Decision Making: 70 year old female here with constipation and intermittent, mild abdominal pain.  No vomiting.  No distentions or symptoms to suggest high-grade obstruction.  Patient is afebrile and hemodynamically stable.  Lab work reviewed and is unremarkable.  No leukocytosis.  No anemia.  CMP with normal LFTs and renal function.  UA without signs of UTI.  Patient sent for CT scan, which was reviewed, shows no evidence of acute abnormality.  She does have moderate stool burden.  Incidental note of duodenal diverticulum. Horseshoe kidney incidentally noted.   Will start on bowel regimen with softeners, lactulose, and refer her to GI for possible EGD in addition to upcoming colonoscopy. Will d/c with outpt follow-up as needed.  ____________________________________________  FINAL CLINICAL IMPRESSION(S) / ED DIAGNOSES  Final diagnoses:  Slow transit constipation  Duodenal diverticulum  Appendicolith     MEDICATIONS GIVEN DURING THIS VISIT:  Medications - No data to  display   ED Discharge Orders          Ordered    lactulose (CHRONULAC) 10 GM/15ML solution        01/22/21 1242    docusate sodium (COLACE) 250 MG capsule  Daily        01/22/21 1242             Note:  This document was prepared using Dragon voice recognition software and may include unintentional dictation errors.   Shaune Pollack, MD 01/22/21 1253

## 2021-01-22 NOTE — ED Notes (Signed)
See triage note. Ambulatory to room. NAD.

## 2021-02-05 ENCOUNTER — Other Ambulatory Visit: Payer: Self-pay

## 2021-02-05 ENCOUNTER — Emergency Department: Payer: Medicare Other

## 2021-02-05 ENCOUNTER — Emergency Department
Admission: EM | Admit: 2021-02-05 | Discharge: 2021-02-05 | Disposition: A | Discharge status: Home / Self Care | Payer: Medicare Other | Attending: Emergency Medicine | Admitting: Emergency Medicine

## 2021-02-05 ENCOUNTER — Encounter: Payer: Self-pay | Admitting: Emergency Medicine

## 2021-02-05 DIAGNOSIS — K5900 Constipation, unspecified: Secondary | ICD-10-CM | POA: Diagnosis not present

## 2021-02-05 NOTE — ED Notes (Signed)
Pt to restroom

## 2021-02-05 NOTE — ED Triage Notes (Signed)
Pt comes into the ED via POV c/o constipation.  Pt states she was seen here 2 weeks ago and completed what the physician said, but she still feels like she is having problems with it.  Pt took dulcolax last night.  Last BM was this morning with some diarrhea.  Pt states she is only able to pass small amounts of stool at a time.

## 2021-02-05 NOTE — ED Provider Notes (Signed)
[facility] Emergency Department Provider Note  ____________________________________________   None    (approximate)  I have reviewed the triage vital signs and the nursing notes.   HISTORY  Chief Complaint Constipation    HPI Meredith Norris is a 70 y.o. female presents emergency department, planing of constipation.  Patient states she was seen here and told she had a lots of stool.  She has been doing what the doctor told her today.  States does not feel that she is passed all of the stool.  States that his stool has become more liquidy with small pieces of solid stool.  Patient is scheduled for routine colonoscopy.  Past Medical History:  Diagnosis Date   GERD (gastroesophageal reflux disease)     There are no problems to display for this patient.   Past Surgical History:  Procedure Laterality Date   FRACTURE SURGERY     SHOULDER SURGERY Left     Prior to Admission medications   Medication Sig Start Date End Date Taking? Authorizing Provider  Cholecalciferol 25 MCG (1000 UT) tablet Take by mouth.    [provider]  docusate sodium (COLACE) 250 MG capsule Take 1 capsule (250 mg total) by mouth daily. 01/22/21 02/21/21  Shaune Pollack, MD  fluticasone (FLONASE) 50 MCG/ACT nasal spray Place into the nose. 08/02/18   [provider]  Glucosamine-Chondroit-Vit C-Mn (GLUCOSAMINE CHONDROITIN COMPLX) TABS Take by mouth.    [provider]  lactulose (CHRONULAC) 10 GM/15ML solution Take 30 mL by mouth up to three times daily for constipation. Start with one dose, repeat up to 2 additional times daily (every 6-8 hr) until bowel movements are soft/liquid, then cut back. Repeat as needed 01/22/21   Shaune Pollack, MD  Omega-3 1000 MG CAPS Take by mouth.    [provider]  pantoprazole (PROTONIX) 40 MG tablet Take by mouth. 08/02/18   [provider]  sertraline (ZOLOFT) 100 MG tablet Take by mouth. 08/02/18    [provider]  vitamin B-12 (CYANOCOBALAMIN) 1000 MCG tablet Take by mouth.    [provider]  vitamin E 180 MG (400 UNITS) capsule Take by mouth.    [provider]    Allergies Ciprofloxacin hcl, Clindamycin, and Triamcinolone acetonide  Family History  Problem Relation Age of Onset   Breast cancer Cousin        mat cousin   Hypertension Mother     Social History Social History   Tobacco Use   Smoking status: Never   Smokeless tobacco: Never  Vaping Use   Vaping Use: Never used  Substance Use Topics   Alcohol use: Never   Drug use: Never    Review of Systems  Constitutional: No fever/chills Eyes: No visual changes. ENT: No sore throat. Respiratory: Denies cough Cardiovascular: Denies chest pain Gastrointestinal: Denies abdominal pain Genitourinary: Negative for dysuria. Musculoskeletal: Negative for back pain. Skin: Negative for rash. Psychiatric: no mood changes,     ____________________________________________   PHYSICAL EXAM:  VITAL SIGNS: ED Triage Vitals  Enc Vitals Group     BP 02/05/21 1011 111/86     Pulse Rate 02/05/21 1011 72     Resp 02/05/21 1011 17     Temp 02/05/21 1011 98.5 F (36.9 C)     Temp Source 02/05/21 1011 Oral     SpO2 02/05/21 1011 97 %     Weight 02/05/21 1012 115 lb 1.3 oz (52.2 kg)     Height 02/05/21 1012  5\' 2"  (1.575 m)     Head Circumference --      Peak Flow --      Pain Score 02/05/21 1012 0     Pain Loc --      Pain Edu? --      Excl. in GC? --     Constitutional: Alert and oriented. Well appearing and in no acute distress. Eyes: Conjunctivae are normal.  Head: Atraumatic. Nose: No congestion/rhinnorhea. Mouth/Throat: Mucous membranes are moist.   Neck:  supple no lymphadenopathy noted Cardiovascular: Normal rate, regular rhythm. Heart sounds are normal Respiratory: Normal respiratory effort.  No retractions, lungs c t a  Abd: soft nontender bs normal all 4 quad GU:  deferred Musculoskeletal: FROM all extremities, warm and well perfused Neurologic:  Normal speech and language.  Skin:  Skin is warm, dry and intact. No rash noted. Psychiatric: Mood and affect are normal. Speech and behavior are normal.  ____________________________________________   LABS (all labs ordered are listed, but only abnormal results are displayed)  Labs Reviewed - No data to display ____________________________________________   ____________________________________________  RADIOLOGY  Abdomen 1 view  ____________________________________________   PROCEDURES  Procedure(s) performed: No  Procedures    ____________________________________________   INITIAL IMPRESSION / ASSESSMENT AND PLAN / ED COURSE  Pertinent labs & imaging results that were available during my care of the patient were reviewed by me and considered in my medical decision making (see chart for details).   Patient is a 70 year old female presents emergency department with concerns of constipation.  See HPI.  Physical exam shows patient to have good bowel sounds and is nontender in the abdomen.  X-ray of the abdomen reviewed by me confirmed by radiology to have no stool burden.  I did explain this to the patient.  Explained to her that she had taken enough laxative that the stool had become more liquidy and thus why she is not passing solid stool.  She is to continue to follow-up with her regular doctor for colonoscopy.  Return emergency department worsening.  She was discharged stable condition.     Meredith Norris was evaluated in Emergency Department on 02/05/2021 for the symptoms described in the history of present illness. She was evaluated in the context of the global COVID-19 pandemic, which necessitated consideration that the patient might be at risk for infection with the SARS-CoV-2 virus that causes COVID-19. Institutional protocols and algorithms that pertain to the evaluation of  patients at risk for COVID-19 are in a state of rapid change based on information released by regulatory bodies including the CDC and federal and state organizations. These policies and algorithms were followed during the patient's care in the ED.    As part of my medical decision making, I reviewed the following data within the electronic MEDICAL RECORD NUMBER Nursing notes reviewed and incorporated, Old chart reviewed, Radiograph reviewed , Notes from prior ED visits, and [place] Controlled Substance Database  ____________________________________________   FINAL CLINICAL IMPRESSION(S) / ED DIAGNOSES  Final diagnoses:  Constipation, unspecified constipation type      NEW MEDICATIONS STARTED DURING THIS VISIT:  New Prescriptions   No medications on file     Note:  This document was prepared using Dragon voice recognition software and may include unintentional dictation errors.    02/07/2021, PA-C 02/05/21 1224    02/07/21, MD 02/06/21 [phone]

## 2021-02-05 NOTE — Discharge Instructions (Addendum)
X-ray does not show a stool burden.  You have been able to pass the majority of the stool.  Please follow-up with your regular doctor for a colonoscopy

## 2021-02-26 ENCOUNTER — Encounter: Payer: Self-pay | Admitting: *Deleted

## 2021-02-27 ENCOUNTER — Encounter: Admission: RE | Payer: Self-pay | Source: Home / Self Care

## 2021-02-27 ENCOUNTER — Ambulatory Visit: Payer: Medicare Other | Admitting: Anesthesiology

## 2021-02-27 ENCOUNTER — Ambulatory Visit: Admission: RE | Admit: 2021-02-27 | Payer: Medicare Other | Source: Home / Self Care

## 2021-02-27 ENCOUNTER — Encounter: Payer: Self-pay | Admitting: *Deleted

## 2021-02-27 ENCOUNTER — Other Ambulatory Visit: Payer: Self-pay

## 2021-02-27 ENCOUNTER — Ambulatory Visit
Admission: RE | Admit: 2021-02-27 | Discharge: 2021-02-27 | Disposition: A | Discharge status: Home / Self Care | Payer: Medicare Other | Attending: Gastroenterology | Admitting: Gastroenterology

## 2021-02-27 ENCOUNTER — Encounter
Admission: RE | Disposition: A | Discharge status: Home / Self Care | Payer: Self-pay | Source: Home / Self Care | Attending: Gastroenterology

## 2021-02-27 DIAGNOSIS — Z1211 Encounter for screening for malignant neoplasm of colon: Secondary | ICD-10-CM | POA: Diagnosis present

## 2021-02-27 DIAGNOSIS — Z888 Allergy status to other drugs, medicaments and biological substances status: Secondary | ICD-10-CM | POA: Insufficient documentation

## 2021-02-27 DIAGNOSIS — K449 Diaphragmatic hernia without obstruction or gangrene: Secondary | ICD-10-CM | POA: Insufficient documentation

## 2021-02-27 DIAGNOSIS — K5730 Diverticulosis of large intestine without perforation or abscess without bleeding: Secondary | ICD-10-CM | POA: Diagnosis not present

## 2021-02-27 DIAGNOSIS — Z79899 Other long term (current) drug therapy: Secondary | ICD-10-CM | POA: Insufficient documentation

## 2021-02-27 DIAGNOSIS — K2100 Gastro-esophageal reflux disease with esophagitis, without bleeding: Secondary | ICD-10-CM | POA: Insufficient documentation

## 2021-02-27 DIAGNOSIS — Z881 Allergy status to other antibiotic agents status: Secondary | ICD-10-CM | POA: Insufficient documentation

## 2021-02-27 DIAGNOSIS — R12 Heartburn: Secondary | ICD-10-CM | POA: Diagnosis not present

## 2021-02-27 DIAGNOSIS — K2970 Gastritis, unspecified, without bleeding: Secondary | ICD-10-CM | POA: Diagnosis not present

## 2021-02-27 DIAGNOSIS — Z791 Long term (current) use of non-steroidal anti-inflammatories (NSAID): Secondary | ICD-10-CM | POA: Diagnosis not present

## 2021-02-27 DIAGNOSIS — K641 Second degree hemorrhoids: Secondary | ICD-10-CM | POA: Insufficient documentation

## 2021-02-27 HISTORY — DX: Personal history of other diseases of the female genital tract: Z87.42

## 2021-02-27 HISTORY — DX: Unspecified osteoarthritis, unspecified site: M19.90

## 2021-02-27 HISTORY — PX: COLONOSCOPY WITH PROPOFOL: SHX5780

## 2021-02-27 HISTORY — PX: ESOPHAGOGASTRODUODENOSCOPY (EGD) WITH PROPOFOL: SHX5813

## 2021-02-27 HISTORY — DX: Depression, unspecified: F32.A

## 2021-02-27 HISTORY — DX: Restless legs syndrome: G25.81

## 2021-02-27 SURGERY — COLONOSCOPY WITH PROPOFOL
Anesthesia: General

## 2021-02-27 MED ORDER — PHENYLEPHRINE HCL (PRESSORS) 10 MG/ML IV SOLN
INTRAVENOUS | Status: DC | PRN
  Administered 2021-02-27 (×2): 50 ug via INTRAVENOUS

## 2021-02-27 MED ORDER — SODIUM CHLORIDE 0.9 % IV SOLN: INTRAVENOUS | Status: DC

## 2021-02-27 MED ORDER — EPHEDRINE SULFATE 50 MG/ML IJ SOLN
INTRAMUSCULAR | Status: DC | PRN
  Administered 2021-02-27: 10 mg via INTRAVENOUS

## 2021-02-27 MED ORDER — PANTOPRAZOLE SODIUM 40 MG PO TBEC: 40.0000 mg | DELAYED_RELEASE_TABLET | Freq: Two times a day (BID) | ORAL | 3 refills | Status: AC

## 2021-02-27 MED ORDER — PANTOPRAZOLE SODIUM 40 MG PO TBEC: 40.0000 mg | DELAYED_RELEASE_TABLET | Freq: Every day | ORAL | 1 refills | Status: AC

## 2021-02-27 MED ORDER — LIDOCAINE HCL (CARDIAC) PF 100 MG/5ML IV SOSY
PREFILLED_SYRINGE | INTRAVENOUS | Status: DC | PRN
  Administered 2021-02-27: 30 mg via INTRAVENOUS

## 2021-02-27 MED ORDER — PROPOFOL 500 MG/50ML IV EMUL
INTRAVENOUS | Status: DC | PRN
  Administered 2021-02-27: 150 ug/kg/min via INTRAVENOUS

## 2021-02-27 NOTE — Anesthesia Postprocedure Evaluation (Signed)
Anesthesia Post Note  Patient: Kashara Blocher  Procedure(s) Performed: COLONOSCOPY WITH PROPOFOL ESOPHAGOGASTRODUODENOSCOPY (EGD) WITH PROPOFOL  Patient location during evaluation: Endoscopy Anesthesia Type: General Level of consciousness: awake and alert Pain management: pain level controlled Vital Signs Assessment: post-procedure vital signs reviewed and stable Respiratory status: spontaneous breathing, nonlabored ventilation and respiratory function stable Cardiovascular status: blood pressure returned to baseline and stable Postop Assessment: no apparent nausea or vomiting Anesthetic complications: no   No notable events documented.   Last Vitals:  Vitals:   02/27/21 0702 02/27/21 0814  BP: 130/68 (!) 97/57  Pulse: 70 91  Resp: 17 16  Temp: 36.7 C (!) 36.1 C  SpO2: 100% 99%    Last Pain:  Vitals:   02/27/21 0844  TempSrc:   PainSc: 0-No pain                 Foye Deer

## 2021-02-27 NOTE — Transfer of Care (Signed)
Immediate Anesthesia Transfer of Care Note  Patient: Meredith Norris  Procedure(s) Performed: COLONOSCOPY WITH PROPOFOL ESOPHAGOGASTRODUODENOSCOPY (EGD) WITH PROPOFOL  Patient Location: PACU  Anesthesia Type:General  Level of Consciousness: awake and sedated  Airway & Oxygen Therapy: Patient Spontanous Breathing and Patient connected to nasal cannula oxygen  Post-op Assessment: Report given to RN and Post -op Vital signs reviewed and stable  Post vital signs: Reviewed and stable  Last Vitals:  Vitals Value Taken Time  BP    Temp    Pulse    Resp    SpO2      Last Pain:  Vitals:   02/27/21 0702  TempSrc: Temporal  PainSc: 0-No pain         Complications: No notable events documented.

## 2021-02-27 NOTE — Interval H&P Note (Signed)
History and Physical Interval Note: Preprocedure H&P from 02/27/21  was reviewed and there was no interval change after seeing and examining the patient.  Written consent was obtained from the patient after discussion of risks, benefits, and alternatives. Patient has consented to proceed with Esophagogastroduodenoscopy and Colonoscopy with possible intervention    02/27/2021 7:35 AM  Meredith Norris  has presented today for surgery, with the diagnosis of abnormal CT Scan of GI tract,CCA SCREEN.  The various methods of treatment have been discussed with the patient and family. After consideration of risks, benefits and other options for treatment, the patient has consented to  Procedure(s): COLONOSCOPY WITH PROPOFOL (N/A) ESOPHAGOGASTRODUODENOSCOPY (EGD) WITH PROPOFOL (N/A) as a surgical intervention.  The patient's history has been reviewed, patient examined, no change in status, stable for surgery.  I have reviewed the patient's chart and labs.  Questions were answered to the patient's satisfaction.     [name]

## 2021-02-27 NOTE — Anesthesia Preprocedure Evaluation (Addendum)
Anesthesia Evaluation  Patient identified by MRN, date of birth, ID band Patient awake    Reviewed: Allergy & Precautions, NPO status , Patient's Chart, lab work & pertinent test results  Airway Mallampati: III  TM Distance: >3 FB Neck ROM: full    Dental no notable dental hx.    Pulmonary neg pulmonary ROS,    Pulmonary exam normal        Cardiovascular Exercise Tolerance: Good negative cardio ROS Normal cardiovascular exam     Neuro/Psych PSYCHIATRIC DISORDERS Depression negative neurological ROS     GI/Hepatic Neg liver ROS, GERD  ,  Endo/Other  negative endocrine ROS  Renal/GU negative Renal ROS  negative genitourinary   Musculoskeletal  (+) Arthritis , Osteoarthritis,    Abdominal Normal abdominal exam  (+)   Peds  Hematology negative hematology ROS (+)   Anesthesia Other Findings Past Medical History: No date: Arthritis No date: Depression No date: GERD (gastroesophageal reflux disease) No date: History of polycystic ovaries No date: Restless leg syndrome  Past Surgical History: No date: FRACTURE SURGERY No date: SHOULDER SURGERY; Left     Reproductive/Obstetrics negative OB ROS                            Anesthesia Physical Anesthesia Plan  ASA: 2  Anesthesia Plan: General   Post-op Pain Management:    Induction:   PONV Risk Score and Plan: Propofol infusion, TIVA and Treatment may vary due to age or medical condition  Airway Management Planned: Nasal Cannula and Natural Airway  Additional Equipment:   Intra-op Plan:   Post-operative Plan:   Informed Consent: I have reviewed the patients History and Physical, chart, labs and discussed the procedure including the risks, benefits and alternatives for the proposed anesthesia with the patient or authorized representative who has indicated his/her understanding and acceptance.     Dental Advisory Given  Plan  Discussed with: Anesthesiologist, CRNA and Surgeon  Anesthesia Plan Comments:         Anesthesia Quick Evaluation

## 2021-02-27 NOTE — H&P (Signed)
[facility] Pre-Procedure H&P   Patient ID: Meredith Norris is a 70 y.o. female.  Gastroenterology Provider: [name], DO  Referring Provider: Vevelyn Pat, NP PCP: [facility]  Date: 02/27/2021  HPI [name] is a 70 y.o. female who presents today for Esophagogastroduodenoscopy and Colonoscopy for GERD (diagnostic EGD) and 10 year screening colonoscopy.  Worsening reflux but now better controlled on protonix 20 mg bid. Bout of constipation has resolved.  Last colonoscopy 2011 with diverticulosis and internal hemorrhoids. EGD at same time demonstrated grade b esophagitis and hiatal hernia.  Patient denies nausea, vomiting, coffee ground emesis, hematemesis, abdominal pain, diarrhea, constipation, melena, hematochezia, fever, chills, dysphagia, odynophagia, jaundice, heartburn/reflux.   Past Medical History:  Diagnosis Date   Arthritis    Depression    GERD (gastroesophageal reflux disease)    History of polycystic ovaries    Restless leg syndrome     Past Surgical History:  Procedure Laterality Date   FRACTURE SURGERY     SHOULDER SURGERY Left     Family History No h/o GI disease or malignancy  Review of Systems  Constitutional:  Negative for activity change, appetite change, fatigue, fever and unexpected weight change.  HENT:  Negative for trouble swallowing and voice change.   Respiratory:  Negative for shortness of breath and wheezing.   Cardiovascular:  Negative for chest pain and palpitations.  Gastrointestinal:  Positive for constipation (resolved). Negative for abdominal distention, abdominal pain, anal bleeding, blood in stool, diarrhea, nausea, rectal pain and vomiting.       Acid reflux  Musculoskeletal:  Negative for arthralgias and myalgias.  Skin:  Negative for color change and pallor.  Neurological:  Negative for dizziness, syncope and weakness.  Psychiatric/Behavioral:  Negative for agitation and  confusion.   All other systems reviewed and are negative.   Medications No current facility-administered medications on file prior to encounter.   Current Outpatient Medications on File Prior to Encounter  Medication Sig Dispense Refill   acetaminophen (TYLENOL) 325 MG tablet Take 650 mg by mouth daily as needed.     Acetylcysteine 600 MG CAPS Take 600 mg by mouth 2 (two) times daily.     azelastine (ASTELIN) 0.1 % nasal spray Place 1 spray into both nostrils 2 (two) times daily. Use in each nostril as directed     BOSWELLIA SERRATA PO Take 500 mg by mouth daily.     Calcium Carb-Cholecalciferol (CALTRATE 600+D3 PO) Take 1,500 mg by mouth daily.     Cholecalciferol 25 MCG (1000 UT) tablet Take by mouth.     Coenzyme Q-10 100 MG capsule Take 100 mg by mouth daily.     fluticasone (FLONASE) 50 MCG/ACT nasal spray Place into the nose.     Glucosamine-Chondroit-Vit C-Mn (GLUCOSAMINE CHONDROITIN COMPLX) TABS Take by mouth.     Ibuprofen 200 MG CAPS Take 1 capsule by mouth daily as needed.     Multiple Vitamin (MULTIVITAMIN) tablet Take 1 tablet by mouth daily.     Multiple Vitamins-Minerals (HAIR/SKIN/NAILS/BIOTIN) TABS Take 1 tablet by mouth daily.     Omega-3 1000 MG CAPS Take by mouth.     pantoprazole (PROTONIX) 40 MG tablet Take by mouth.     sertraline (ZOLOFT) 100 MG tablet Take by mouth.     vitamin B-12 (CYANOCOBALAMIN) 1000 MCG tablet Take by mouth.     vitamin C (ASCORBIC ACID) 500 MG tablet Take 500 mg by mouth daily.     vitamin E 180  MG (400 UNITS) capsule Take by mouth.     lactulose (CHRONULAC) 10 GM/15ML solution Take 30 mL by mouth up to three times daily for constipation. Start with one dose, repeat up to 2 additional times daily (every 6-8 hr) until bowel movements are soft/liquid, then cut back. Repeat as needed (Patient not taking: Reported on 02/27/2021) 473 mL 0    Pertinent medications related to GI and procedure were reviewed by me with the patient prior to the  procedure   Current Facility-Administered Medications:    0.9 %  sodium chloride infusion, , Intravenous, Continuous, [name], DO, Last Rate: 20 mL/hr at 02/27/21 0932, New Bag at 02/27/21 6712  sodium chloride 20 mL/hr at 02/27/21 4580       Allergies  Allergen Reactions   Ciprofloxacin Hcl Other (See Comments)   Clindamycin Other (See Comments)    Caused C difficile colitis   Triamcinolone Acetonide     Other reaction(s): Other (See Comments) "Feel bad"   Allergies were reviewed by me prior to the procedure  Objective    Vitals:   02/27/21 0702  BP: 130/68  Pulse: 70  Resp: 17  Temp: 98.1 F (36.7 C)  TempSrc: Temporal  SpO2: 100%  Weight: 52.2 kg  Height: 5\' 2"  (1.575 m)     Physical Exam Vitals reviewed.  Constitutional:      General: She is not in acute distress.    Appearance: Normal appearance. She is normal weight. She is not ill-appearing, toxic-appearing or diaphoretic.  HENT:     Head: Normocephalic and atraumatic.     Nose: Nose normal.     Mouth/Throat:     Mouth: Mucous membranes are moist.     Pharynx: Oropharynx is clear.  Eyes:     General: No scleral icterus.    Extraocular Movements: Extraocular movements intact.  Cardiovascular:     Rate and Rhythm: Normal rate and regular rhythm.     Heart sounds: Normal heart sounds. No murmur heard.   No friction rub. No gallop.  Pulmonary:     Effort: Pulmonary effort is normal. No respiratory distress.     Breath sounds: Normal breath sounds. No wheezing, rhonchi or rales.  Abdominal:     General: Abdomen is flat. Bowel sounds are normal. There is no distension.     Palpations: Abdomen is soft.     Tenderness: There is no abdominal tenderness. There is no guarding or rebound.  Musculoskeletal:     Cervical back: Neck supple.     Right lower leg: No edema.     Left lower leg: No edema.  Skin:    General: Skin is warm and dry.     Coloration: Skin is not jaundiced or pale.   Neurological:     General: No focal deficit present.     Mental Status: She is alert and oriented to person, place, and time. Mental status is at baseline.  Psychiatric:        Mood and Affect: Mood normal.        Behavior: Behavior normal.        Thought Content: Thought content normal.        Judgment: Judgment normal.     Assessment:  [name] is a 70 y.o. female  who presents today for Esophagogastroduodenoscopy and Colonoscopy for GERD (diagnostic EGD) and 10 year screening colonoscopy.  Plan:  Esophagogastroduodenoscopy and Colonoscopy with possible intervention today  Esophagogastroduodenoscopy and colonoscopy with possible biopsy, control  of bleeding, polypectomy, and interventions as necessary has been discussed with the patient/patient representative. Informed consent was obtained from the patient/patient representative after explaining the indication, nature, and risks of the procedure including but not limited to death, bleeding, perforation, missed neoplasm/lesions, cardiorespiratory compromise, and reaction to medications. Opportunity for questions was given and appropriate answers were provided. Patient/patient representative has verbalized understanding is amenable to undergoing the procedure.   [name], DO  [facility]  Portions of the record may have been created with voice recognition software. Occasional wrong-word or 'sound-a-like' substitutions may have occurred due to the inherent limitations of voice recognition software.  Read the chart carefully and recognize, using context, where substitutions may have occurred.

## 2021-02-27 NOTE — Anesthesia Procedure Notes (Signed)
Date/Time: 02/27/2021 7:40 AM Performed by: Tonia Ghent Pre-anesthesia Checklist: Patient identified, Emergency Drugs available, Suction available, Patient being monitored and Timeout performed Patient Re-evaluated:Patient Re-evaluated prior to induction Oxygen Delivery Method: Nasal cannula Preoxygenation: Pre-oxygenation with 100% oxygen Induction Type: IV induction Airway Equipment and Method: Bite block Placement Confirmation: positive ETCO2 and CO2 detector

## 2021-02-27 NOTE — Op Note (Signed)
[facility] Patient Name: Meredith Norris Procedure Date: 02/27/2021 7:34 AM MRN: [id number] Account #: [id number] Date of Birth: 1950-07-14 Admit Type: Outpatient Age: 70 Room: [facility] ENDO ROOM 1 Gender: Female Note Status: Finalized Instrument Name: Altamese Cabal Endoscope [id number] Procedure:             Upper GI endoscopy Indications:           Heartburn Providers:             Rueben Bash, DO Referring MD:          Christena Flake. Raechel Ache, MD (Referring MD) Medicines:             Monitored Anesthesia Care Complications:         No immediate complications. Estimated blood loss:                         Minimal. Procedure:             Pre-Anesthesia Assessment:                        - Prior to the procedure, a History and Physical was                         performed, and patient medications and allergies were                         reviewed. The patient is competent. The risks and                         benefits of the procedure and the sedation options and                         risks were discussed with the patient. All questions                         were answered and informed consent was obtained.                         Patient identification and proposed procedure were                         verified by the physician, the nurse, the anesthetist                         and the technician in the endoscopy suite. Mental                         Status Examination: alert and oriented. Airway                         Examination: normal oropharyngeal airway and neck                         mobility. Respiratory Examination: clear to                         auscultation. CV Examination: RRR, no murmurs, no S3  or S4. Prophylactic Antibiotics: The patient does not                         require prophylactic antibiotics. Prior                         Anticoagulants: The patient has taken no previous                          anticoagulant or antiplatelet agents. ASA Grade                         Assessment: II - A patient with mild systemic disease.                         After reviewing the risks and benefits, the patient                         was deemed in satisfactory condition to undergo the                         procedure. The anesthesia plan was to use monitored                         anesthesia care (MAC). Immediately prior to                         administration of medications, the patient was                         re-assessed for adequacy to receive sedatives. The                         heart rate, respiratory rate, oxygen saturations,                         blood pressure, adequacy of pulmonary ventilation, and                         response to care were monitored throughout the                         procedure. The physical status of the patient was                         re-assessed after the procedure.                        After obtaining informed consent, the endoscope was                         passed under direct vision. Throughout the procedure,                         the patient's blood pressure, pulse, and oxygen                         saturations were monitored continuously. The Endoscope  was introduced through the mouth, and advanced to the                         second part of duodenum. The upper GI endoscopy was                         accomplished without difficulty. The patient tolerated                         the procedure well. Findings:      The duodenal bulb, first portion of the duodenum and second portion of       the duodenum were normal. Estimated blood loss: none.      Localized mild inflammation characterized by erythema was found in the       gastric antrum. Biopsies were taken with a cold forceps for histology.       Estimated blood loss was minimal.      A 3 cm hiatal hernia was present. Estimated blood loss: none.      LA  Grade C (one or more mucosal breaks continuous between tops of 2 or       more mucosal folds, less than 75% circumference) esophagitis with no       bleeding was found 36 cm from the incisors. Estimated blood loss: none.      Esophagogastric landmarks were identified: the gastroesophageal junction       was found at 36 cm from the incisors.      The exam of the esophagus was otherwise normal. Impression:            - Normal duodenal bulb, first portion of the duodenum                         and second portion of the duodenum.                        - Gastritis. Biopsied.                        - 3 cm hiatal hernia.                        - LA Grade C reflux esophagitis with no bleeding.                        - Esophagogastric landmarks identified. Recommendation:        - Discharge patient to home.                        - Resume previous diet.                        - Use Protonix (pantoprazole) 40 mg PO BID.                        - Continue present medications.                        - Await pathology results.                        - Repeat upper endoscopy in  8 weeks for retreatment.                        - Return to referring physician as previously                         scheduled. Procedure Code(s):     --- Professional ---                        [phone], Esophagogastroduodenoscopy, flexible,                         transoral; with biopsy, single or multiple Diagnosis Code(s):     --- Professional ---                        K29.70, Gastritis, unspecified, without bleeding                        K44.9, Diaphragmatic hernia without obstruction or                         gangrene                        K21.00, Gastro-esophageal reflux disease with                         esophagitis, without bleeding                        R12, Heartburn CPT copyright 2019 American Medical Association. All rights reserved. The codes documented in this report are preliminary and upon coder review  may  be revised to meet current compliance requirements. Attending Participation:      I personally performed the entire procedure. Volney American, DO Annamaria Helling DO, DO 02/27/2021 8:17:44 AM This report has been signed electronically. Number of Addenda: 0 Note Initiated On: 02/27/2021 7:34 AM Estimated Blood Loss:  Estimated blood loss was minimal.      [facility]

## 2021-02-27 NOTE — Op Note (Signed)
[facility] Patient Name: Meredith Norris Procedure Date: 02/27/2021 7:33 AM MRN: [id number] Account #: [id number] Date of Birth: April 12, 1951 Admit Type: Outpatient Age: 70 Room: [facility] ENDO ROOM 1 Gender: Female Note Status: Finalized Instrument Name: Colonoscope [id number] Procedure:             Colonoscopy Indications:           Screening for colorectal malignant neoplasm Providers:             Rueben Bash, DO Referring MD:          Christena Flake. Raechel Ache, MD (Referring MD) Medicines:             Monitored Anesthesia Care Complications:         No immediate complications. Estimated blood loss: None. Procedure:             Pre-Anesthesia Assessment:                        - Prior to the procedure, a History and Physical was                         performed, and patient medications and allergies were                         reviewed. The patient is competent. The risks and                         benefits of the procedure and the sedation options and                         risks were discussed with the patient. All questions                         were answered and informed consent was obtained.                         Patient identification and proposed procedure were                         verified by the physician, the nurse, the anesthetist                         and the technician in the endoscopy suite. Mental                         Status Examination: alert and oriented. Airway                         Examination: normal oropharyngeal airway and neck                         mobility. Respiratory Examination: clear to                         auscultation. CV Examination: RRR, no murmurs, no S3                         or S4. Prophylactic Antibiotics: The patient does not  require prophylactic antibiotics. Prior                         Anticoagulants: The patient has taken no previous                          anticoagulant or antiplatelet agents. ASA Grade                         Assessment: II - A patient with mild systemic disease.                         After reviewing the risks and benefits, the patient                         was deemed in satisfactory condition to undergo the                         procedure. The anesthesia plan was to use monitored                         anesthesia care (MAC). Immediately prior to                         administration of medications, the patient was                         re-assessed for adequacy to receive sedatives. The                         heart rate, respiratory rate, oxygen saturations,                         blood pressure, adequacy of pulmonary ventilation, and                         response to care were monitored throughout the                         procedure. The physical status of the patient was                         re-assessed after the procedure.                        After obtaining informed consent, the colonoscope was                         passed under direct vision. Throughout the procedure,                         the patient's blood pressure, pulse, and oxygen                         saturations were monitored continuously. The                         Colonoscope was introduced through the anus and  advanced to the the cecum, identified by appendiceal                         orifice and ileocecal valve. The colonoscopy was                         performed without difficulty. The patient tolerated                         the procedure well. The quality of the bowel                         preparation was evaluated using the BBPS [facility] Bowel                         Preparation Scale) with scores of: Right Colon = 3,                         Transverse Colon = 3 and Left Colon = 3 (entire mucosa                         seen well with no residual staining, small fragments                          of stool or opaque liquid). The total BBPS score                         equals 9. The ileocecal valve, appendiceal orifice,                         and rectum were photographed. Findings:      The perianal and digital rectal examinations were normal. Pertinent       negatives include normal sphincter tone.      Multiple small and large-mouthed diverticula were found in the left       colon. Estimated blood loss was minimal. One of the diverticula were       mildly inflamed vs prep effect. No purulence.      Non-bleeding internal hemorrhoids were found during retroflexion. The       hemorrhoids were Grade II (internal hemorrhoids that prolapse but reduce       spontaneously). Estimated blood loss: none.      The exam was otherwise without abnormality on direct and retroflexion       views. Impression:            - Diverticulosis in the left colon.                        - Non-bleeding internal hemorrhoids.                        - The examination was otherwise normal on direct and                         retroflexion views.                        - No specimens collected. Recommendation:        - Discharge patient  to home.                        - Resume previous diet.                        - Continue present medications.                        - Repeat colonoscopy in 10 years for screening                         purposes.                        - Return to referring physician as previously                         scheduled. Procedure Code(s):     --- Professional ---                        Z3299, Colorectal cancer screening; colonoscopy on                         individual not meeting criteria for high risk Diagnosis Code(s):     --- Professional ---                        Z12.11, Encounter for screening for malignant neoplasm                         of colon                        K64.1, Second degree hemorrhoids                        K57.30, Diverticulosis of large intestine  without                         perforation or abscess without bleeding CPT copyright 2019 American Medical Association. All rights reserved. The codes documented in this report are preliminary and upon coder review may  be revised to meet current compliance requirements. Attending Participation:      I personally performed the entire procedure. Volney American, DO Annamaria Helling DO, DO 02/27/2021 8:25:32 AM This report has been signed electronically. Number of Addenda: 0 Note Initiated On: 02/27/2021 7:33 AM Scope Withdrawal Time: 0 hours 14 minutes 27 seconds  Total Procedure Duration: 0 hours 22 minutes 21 seconds  Estimated Blood Loss:  Estimated blood loss: none.      [facility]

## 2021-02-28 ENCOUNTER — Encounter: Payer: Self-pay | Admitting: Gastroenterology

## 2021-02-28 LAB — SURGICAL PATHOLOGY

## 2021-07-11 ENCOUNTER — Encounter: Payer: Self-pay | Admitting: Gastroenterology

## 2021-07-13 ENCOUNTER — Encounter: Payer: Self-pay | Admitting: Gastroenterology

## 2021-07-13 NOTE — H&P (Signed)
Pre-Procedure H&P   Patient ID: Meredith Norris is a 71 y.o. female.  Gastroenterology Provider: Annamaria Helling, DO  Referring Provider: Stephens November, NP PCP: [place]  Date: 07/14/2021  HPI Meredith Norris is a 71 y.o. female who presents today for Esophagogastroduodenoscopy for Esophagitis follow-up. Patient with longstanding reflux symptomatically controlled on Protonix.  Underwent EGD in September 2022 demonstrating grade C esophagitis, 3 cm hiatal hernia, H. pylori negative gastritis and negative for gastric intestinal metaplasia.  She is undergoing endoscopy today for evaluation of esophagitis healing.  Currently taking Protonix 40 mg twice a day No other acute GI complaints.  Past Medical History:  Diagnosis Date   Arthritis    Depression    GERD (gastroesophageal reflux disease)    History of polycystic ovaries    Restless leg syndrome     Past Surgical History:  Procedure Laterality Date   COLONOSCOPY WITH PROPOFOL N/A 02/27/2021   Procedure: COLONOSCOPY WITH PROPOFOL;  Surgeon: Annamaria Helling, DO;  Location: [facility] ENDOSCOPY;  Service: Gastroenterology;  Laterality: N/A;   ESOPHAGOGASTRODUODENOSCOPY (EGD) WITH PROPOFOL N/A 02/27/2021   Procedure: ESOPHAGOGASTRODUODENOSCOPY (EGD) WITH PROPOFOL;  Surgeon: Annamaria Helling, DO;  Location: [facility] ENDOSCOPY;  Service: Gastroenterology;  Laterality: N/A;   FRACTURE SURGERY     SHOULDER SURGERY Left     Family History No h/o GI disease or malignancy  Review of Systems  Constitutional:  Negative for activity change, appetite change, chills, diaphoresis, fatigue, fever and unexpected weight change.  HENT:  Negative for trouble swallowing and voice change.   Respiratory:  Negative for shortness of breath and wheezing.   Cardiovascular:  Negative for chest pain, palpitations and leg swelling.  Gastrointestinal:  Negative for abdominal distention, abdominal pain, anal bleeding, blood in  stool, constipation, diarrhea, nausea, rectal pain and vomiting.  Musculoskeletal:  Negative for arthralgias and myalgias.  Skin:  Negative for color change and pallor.  Neurological:  Negative for dizziness, syncope and weakness.  Psychiatric/Behavioral:  Negative for confusion.   All other systems reviewed and are negative.   Medications No current facility-administered medications on file prior to encounter.   Current Outpatient Medications on File Prior to Encounter  Medication Sig Dispense Refill   acetaminophen (TYLENOL) 325 MG tablet Take 650 mg by mouth daily as needed.     Acetylcysteine 600 MG CAPS Take 600 mg by mouth 2 (two) times daily.     azelastine (ASTELIN) 0.1 % nasal spray Place 1 spray into both nostrils 2 (two) times daily. Use in each nostril as directed     BOSWELLIA SERRATA PO Take 500 mg by mouth daily.     Calcium Carb-Cholecalciferol (CALTRATE 600+D3 PO) Take 1,500 mg by mouth daily.     Cholecalciferol 25 MCG (1000 UT) tablet Take by mouth.     Coenzyme Q-10 100 MG capsule Take 100 mg by mouth daily.     fluticasone (FLONASE) 50 MCG/ACT nasal spray Place into the nose.     Glucosamine-Chondroit-Vit C-Mn (GLUCOSAMINE CHONDROITIN COMPLX) TABS Take by mouth.     Ibuprofen 200 MG CAPS Take 1 capsule by mouth daily as needed.     Multiple Vitamin (MULTIVITAMIN) tablet Take 1 tablet by mouth daily.     Multiple Vitamins-Minerals (HAIR/SKIN/NAILS/BIOTIN) TABS Take 1 tablet by mouth daily.     Omega-3 1000 MG CAPS Take by mouth.     pantoprazole (PROTONIX) 40 MG tablet Take 1 tablet (40 mg total) by mouth daily. 30 tablet 1  pantoprazole (PROTONIX) 40 MG tablet Take 1 tablet (40 mg total) by mouth 2 (two) times daily. 60 tablet 3   sertraline (ZOLOFT) 100 MG tablet Take by mouth.     vitamin B-12 (CYANOCOBALAMIN) 1000 MCG tablet Take by mouth.     vitamin C (ASCORBIC ACID) 500 MG tablet Take 500 mg by mouth daily.     lactulose (CHRONULAC) 10 GM/15ML solution Take  30 mL by mouth up to three times daily for constipation. Start with one dose, repeat up to 2 additional times daily (every 6-8 hr) until bowel movements are soft/liquid, then cut back. Repeat as needed (Patient not taking: Reported on 02/27/2021) 473 mL 0   vitamin E 180 MG (400 UNITS) capsule Take by mouth.      Pertinent medications related to GI and procedure were reviewed by me with the patient prior to the procedure   Current Facility-Administered Medications:    0.9 %  sodium chloride infusion, , Intravenous, Continuous, Annamaria Helling, DO, Last Rate: 20 mL/hr at 07/14/21 0710, 20 mL/hr at 07/14/21 0710      Allergies  Allergen Reactions   Ciprofloxacin Hcl Other (See Comments)   Clindamycin Other (See Comments)    Caused C difficile colitis   Triamcinolone Acetonide     Other reaction(s): Other (See Comments) "Feel bad"   Allergies were reviewed by me prior to the procedure  Objective    Vitals:   07/14/21 0658  BP: 128/70  Pulse: (!) 59  Resp: 20  Temp: (!) 96.8 F (36 C)  SpO2: 100%  Weight: 52.2 kg  Height: 5\' 2"  (1.575 m)     Physical Exam Vitals and nursing note reviewed.  Constitutional:      General: She is not in acute distress.    Appearance: Normal appearance. She is not ill-appearing, toxic-appearing or diaphoretic.  HENT:     Head: Normocephalic and atraumatic.     Nose: Nose normal.     Mouth/Throat:     Mouth: Mucous membranes are moist.     Pharynx: Oropharynx is clear.  Eyes:     General: No scleral icterus.    Extraocular Movements: Extraocular movements intact.  Cardiovascular:     Rate and Rhythm: Regular rhythm. Bradycardia present.     Heart sounds: Normal heart sounds. No murmur heard.   No friction rub. No gallop.  Pulmonary:     Effort: Pulmonary effort is normal. No respiratory distress.     Breath sounds: Normal breath sounds. No wheezing, rhonchi or rales.  Abdominal:     General: Abdomen is flat. Bowel sounds are  normal. There is no distension.     Palpations: Abdomen is soft.     Tenderness: There is no abdominal tenderness. There is no guarding or rebound.  Musculoskeletal:     Cervical back: Neck supple.     Right lower leg: No edema.     Left lower leg: No edema.  Skin:    General: Skin is warm and dry.     Coloration: Skin is not jaundiced or pale.  Neurological:     General: No focal deficit present.     Mental Status: She is alert and oriented to person, place, and time. Mental status is at baseline.  Psychiatric:        Mood and Affect: Mood normal.        Behavior: Behavior normal.        Thought Content: Thought content normal.  Judgment: Judgment normal.     Assessment:  [name] is a 71 y.o. female  who presents today for Esophagogastroduodenoscopy for Esophagitis follow-up.  Plan:  Esophagogastroduodenoscopy with possible intervention today  Esophagogastroduodenoscopy with possible biopsy, control of bleeding, polypectomy, and interventions as necessary has been discussed with the patient/patient representative. Informed consent was obtained from the patient/patient representative after explaining the indication, nature, and risks of the procedure including but not limited to death, bleeding, perforation, missed neoplasm/lesions, cardiorespiratory compromise, and reaction to medications. Opportunity for questions was given and appropriate answers were provided. Patient/patient representative has verbalized understanding is amenable to undergoing the procedure.   Annamaria Helling, DO  [facility] Gastroenterology  Portions of the record may have been created with voice recognition software. Occasional wrong-word or 'sound-a-like' substitutions may have occurred due to the inherent limitations of voice recognition software.  Read the chart carefully and recognize, using context, where substitutions may have occurred.

## 2021-07-14 ENCOUNTER — Ambulatory Visit: Payer: Medicare Other | Admitting: Anesthesiology

## 2021-07-14 ENCOUNTER — Encounter: Payer: Self-pay | Admitting: Gastroenterology

## 2021-07-14 ENCOUNTER — Ambulatory Visit
Admission: RE | Admit: 2021-07-14 | Discharge: 2021-07-14 | Disposition: A | Discharge status: Home / Self Care | Payer: Medicare Other | Attending: Gastroenterology | Admitting: Gastroenterology

## 2021-07-14 ENCOUNTER — Encounter
Admission: RE | Disposition: A | Discharge status: Home / Self Care | Payer: Self-pay | Source: Home / Self Care | Attending: Gastroenterology

## 2021-07-14 DIAGNOSIS — K449 Diaphragmatic hernia without obstruction or gangrene: Secondary | ICD-10-CM | POA: Insufficient documentation

## 2021-07-14 DIAGNOSIS — K2100 Gastro-esophageal reflux disease with esophagitis, without bleeding: Secondary | ICD-10-CM | POA: Insufficient documentation

## 2021-07-14 DIAGNOSIS — F32A Depression, unspecified: Secondary | ICD-10-CM | POA: Diagnosis not present

## 2021-07-14 DIAGNOSIS — K5710 Diverticulosis of small intestine without perforation or abscess without bleeding: Secondary | ICD-10-CM | POA: Insufficient documentation

## 2021-07-14 DIAGNOSIS — Z79899 Other long term (current) drug therapy: Secondary | ICD-10-CM | POA: Diagnosis not present

## 2021-07-14 HISTORY — PX: ESOPHAGOGASTRODUODENOSCOPY: SHX5428

## 2021-07-14 SURGERY — EGD (ESOPHAGOGASTRODUODENOSCOPY)
Anesthesia: General

## 2021-07-14 MED ORDER — SODIUM CHLORIDE 0.9 % IV SOLN
INTRAVENOUS | Status: DC
  Administered 2021-07-14: 20 mL/h via INTRAVENOUS

## 2021-07-14 MED ORDER — PROPOFOL 500 MG/50ML IV EMUL
INTRAVENOUS | Status: DC | PRN
  Administered 2021-07-14: 70 mg via INTRAVENOUS
  Administered 2021-07-14: 30 mg via INTRAVENOUS

## 2021-07-14 MED ORDER — PROPOFOL 500 MG/50ML IV EMUL: INTRAVENOUS | Status: DC | PRN

## 2021-07-14 MED ORDER — LIDOCAINE HCL (CARDIAC) PF 100 MG/5ML IV SOSY
PREFILLED_SYRINGE | INTRAVENOUS | Status: DC | PRN
  Administered 2021-07-14: 50 mg via INTRAVENOUS

## 2021-07-14 NOTE — Anesthesia Postprocedure Evaluation (Signed)
Anesthesia Post Note  Patient: Meredith Norris  Procedure(s) Performed: ESOPHAGOGASTRODUODENOSCOPY (EGD)  Patient location during evaluation: PACU Anesthesia Type: General Level of consciousness: awake and alert, oriented and patient cooperative Pain management: pain level controlled Vital Signs Assessment: post-procedure vital signs reviewed and stable Respiratory status: spontaneous breathing, nonlabored ventilation and respiratory function stable Cardiovascular status: blood pressure returned to baseline and stable Postop Assessment: adequate PO intake Anesthetic complications: no   No notable events documented.   Last Vitals:  Vitals:   07/14/21 0810 07/14/21 0820  BP: (!) 134/56 (!) 171/85  Pulse: (!) 56 64  Resp: 15 12  Temp:    SpO2: 98% 99%    Last Pain:  Vitals:   07/14/21 0658  PainSc: 0-No pain                 Reed Breech

## 2021-07-14 NOTE — Anesthesia Procedure Notes (Signed)
Procedure Name: MAC Date/Time: 07/14/2021 7:30 AM Performed by: Jerrye Noble, CRNA Pre-anesthesia Checklist: Emergency Drugs available, Suction available, Patient identified and Patient being monitored Patient Re-evaluated:Patient Re-evaluated prior to induction Oxygen Delivery Method: Nasal cannula

## 2021-07-14 NOTE — Anesthesia Preprocedure Evaluation (Signed)
Anesthesia Evaluation  Patient identified by MRN, date of birth, ID band Patient awake    Reviewed: Allergy & Precautions, NPO status , Patient's Chart, lab work & pertinent test results  History of Anesthesia Complications Negative for: history of anesthetic complications  Airway Mallampati: I   Neck ROM: Full    Dental no notable dental hx.    Pulmonary neg pulmonary ROS,    Pulmonary exam normal breath sounds clear to auscultation       Cardiovascular Exercise Tolerance: Good negative cardio ROS Normal cardiovascular exam Rhythm:Regular Rate:Normal     Neuro/Psych PSYCHIATRIC DISORDERS Depression negative neurological ROS     GI/Hepatic GERD  ,  Endo/Other  negative endocrine ROS  Renal/GU negative Renal ROS     Musculoskeletal  (+) Arthritis ,   Abdominal   Peds  Hematology negative hematology ROS (+)   Anesthesia Other Findings   Reproductive/Obstetrics                             Anesthesia Physical Anesthesia Plan  ASA: 2  Anesthesia Plan: General   Post-op Pain Management:    Induction: Intravenous  PONV Risk Score and Plan: 3 and Propofol infusion, TIVA and Treatment may vary due to age or medical condition  Airway Management Planned: Natural Airway  Additional Equipment:   Intra-op Plan:   Post-operative Plan:   Informed Consent: I have reviewed the patients History and Physical, chart, labs and discussed the procedure including the risks, benefits and alternatives for the proposed anesthesia with the patient or authorized representative who has indicated his/her understanding and acceptance.       Plan Discussed with: CRNA  Anesthesia Plan Comments: (LMA/GETA backup discussed.  Patient consented for risks of anesthesia including but not limited to:  - adverse reactions to medications - damage to eyes, teeth, lips or other oral mucosa - nerve damage due  to positioning  - sore throat or hoarseness - damage to heart, brain, nerves, lungs, other parts of body or loss of life  Informed patient about role of CRNA in peri- and intra-operative care.  Patient voiced understanding.)        Anesthesia Quick Evaluation

## 2021-07-14 NOTE — Interval H&P Note (Signed)
History and Physical Interval Note: Preprocedure H&P from 07/14/21  was reviewed and there was no interval change after seeing and examining the patient.  Written consent was obtained from the patient after discussion of risks, benefits, and alternatives. Patient has consented to proceed with Esophagogastroduodenoscopy with possible intervention   07/14/2021 7:26 AM  Meredith Norris  has presented today for surgery, with the diagnosis of COLON CANCER SCREENING.  The various methods of treatment have been discussed with the patient and family. After consideration of risks, benefits and other options for treatment, the patient has consented to  Procedure(s): ESOPHAGOGASTRODUODENOSCOPY (EGD) (N/A) as a surgical intervention.  The patient's history has been reviewed, patient examined, no change in status, stable for surgery.  I have reviewed the patient's chart and labs.  Questions were answered to the patient's satisfaction.     Annamaria Helling

## 2021-07-14 NOTE — Transfer of Care (Signed)
Immediate Anesthesia Transfer of Care Note  Patient: Meredith Norris  Procedure(s) Performed: ESOPHAGOGASTRODUODENOSCOPY (EGD)  Patient Location: PACU and Endoscopy Unit  Anesthesia Type:General  Level of Consciousness: awake, drowsy and patient cooperative  Airway & Oxygen Therapy: Patient Spontanous Breathing  Post-op Assessment: Report given to RN and Post -op Vital signs reviewed and stable  Post vital signs: Reviewed and stable  Last Vitals:  Vitals Value Taken Time  BP 92/61   Temp    Pulse 64 07/14/21 0752  Resp 13 07/14/21 0752  SpO2 98 % 07/14/21 0752  Vitals shown include unvalidated device data.  Last Pain:  Vitals:   07/14/21 0658  PainSc: 0-No pain         Complications: No notable events documented.

## 2021-07-14 NOTE — Op Note (Signed)
[facility] Gastroenterology Patient Name: Meredith Norris Procedure Date: 07/14/2021 7:25 AM MRN: [id number] Account #: [id number] Date of Birth: 06/16/1951 Admit Type: Outpatient Age: 71 Room: [facility] ENDO ROOM 2 Gender: Female Note Status: Finalized Instrument Name: Upper Endoscope [id number] Procedure:             Upper GI endoscopy Indications:           Follow-up of reflux esophagitis Providers:             Rueben Bash, DO Referring MD:          [place]:             Monitored Anesthesia Care Complications:         No immediate complications. Estimated blood loss:                         Minimal. Procedure:             Pre-Anesthesia Assessment:                        - Prior to the procedure, a History and Physical was                         performed, and patient medications and allergies were                         reviewed. The patient is competent. The risks and                         benefits of the procedure and the sedation options and                         risks were discussed with the patient. All questions                         were answered and informed consent was obtained.                         Patient identification and proposed procedure were                         verified by the physician, the nurse, the anesthetist                         and the technician in the endoscopy suite. Mental                         Status Examination: alert and oriented. Airway                         Examination: normal oropharyngeal airway and neck                         mobility. Respiratory Examination: clear to                         auscultation. CV Examination: RRR, no murmurs, no S3  or S4. Prophylactic Antibiotics: The patient does not                         require prophylactic antibiotics. Prior                         Anticoagulants: The patient has taken no previous                          anticoagulant or antiplatelet agents. ASA Grade                         Assessment: II - A patient with mild systemic disease.                         After reviewing the risks and benefits, the patient                         was deemed in satisfactory condition to undergo the                         procedure. The anesthesia plan was to use monitored                         anesthesia care (MAC). Immediately prior to                         administration of medications, the patient was                         re-assessed for adequacy to receive sedatives. The                         heart rate, respiratory rate, oxygen saturations,                         blood pressure, adequacy of pulmonary ventilation, and                         response to care were monitored throughout the                         procedure. The physical status of the patient was                         re-assessed after the procedure.                        After obtaining informed consent, the endoscope was                         passed under direct vision. Throughout the procedure,                         the patient's blood pressure, pulse, and oxygen                         saturations were monitored continuously. The Endoscope  was introduced through the mouth, and advanced to the                         second part of duodenum. The upper GI endoscopy was                         accomplished without difficulty. The patient tolerated                         the procedure well. Findings:      A medium non-bleeding diverticulum was found in the second portion of       the duodenum. Estimated blood loss: none.      The exam of the duodenum was otherwise normal.      A 3 cm hiatal hernia was present. Estimated blood loss: none.      The entire examined stomach was normal. Improved gastritis from previous       exam.      LA Grade B (one or more mucosal breaks greater than 5 mm, not  extending       between the tops of two mucosal folds) esophagitis with no bleeding was       found. Biopsies were taken with a cold forceps for histology. Estimated       blood loss was minimal.      Esophagogastric landmarks were identified: the gastroesophageal junction       was found at 36 cm from the incisors.      The exam of the esophagus was otherwise normal. Impression:            - Non-bleeding duodenal diverticulum.                        - 3 cm hiatal hernia.                        - Normal stomach.                        - LA Grade B reflux esophagitis with no bleeding.                         Biopsied.                        - Esophagogastric landmarks identified. Recommendation:        - Discharge patient to home.                        - Resume previous diet.                        - Continue present medications.                        - Ensure taking protonix 40 mg - once 30 minutes prior                         to breakfast, once 30 minutes prior to supper                        - Await pathology results.                        -  Recommend pH study if not already perfomed. Consider                         surgical evaluation for hiatal hernia repair.                        - Return to GI clinic as previously scheduled. Procedure Code(s):     --- Professional ---                        [phone], Esophagogastroduodenoscopy, flexible,                         transoral; with biopsy, single or multiple Diagnosis Code(s):     --- Professional ---                        K44.9, Diaphragmatic hernia without obstruction or                         gangrene                        K21.00, Gastro-esophageal reflux disease with                         esophagitis, without bleeding                        K57.10, Diverticulosis of small intestine without                         perforation or abscess without bleeding CPT copyright 2019 [employer]. All rights  reserved. The codes documented in this report are preliminary and upon coder review may  be revised to meet current compliance requirements. Attending Participation:      I personally performed the entire procedure. Volney American, DO Annamaria Helling DO, DO 07/14/2021 7:52:30 AM This report has been signed electronically. Number of Addenda: 0 Note Initiated On: 07/14/2021 7:25 AM Estimated Blood Loss:  Estimated blood loss was minimal.      [facility]

## 2021-07-15 ENCOUNTER — Encounter: Payer: Self-pay | Admitting: Gastroenterology

## 2021-07-15 LAB — SURGICAL PATHOLOGY

## 2021-09-10 ENCOUNTER — Other Ambulatory Visit: Payer: Self-pay | Admitting: Nurse Practitioner

## 2021-09-10 DIAGNOSIS — Z1231 Encounter for screening mammogram for malignant neoplasm of breast: Secondary | ICD-10-CM

## 2021-09-16 ENCOUNTER — Other Ambulatory Visit: Payer: Self-pay | Admitting: Gastroenterology

## 2021-09-16 DIAGNOSIS — R1084 Generalized abdominal pain: Secondary | ICD-10-CM

## 2021-09-25 ENCOUNTER — Ambulatory Visit
Admission: RE | Admit: 2021-09-25 | Discharge: 2021-09-25 | Disposition: A | Discharge status: Home / Self Care | Payer: Medicare Other | Source: Ambulatory Visit | Attending: Gastroenterology | Admitting: Gastroenterology

## 2021-09-25 DIAGNOSIS — R1084 Generalized abdominal pain: Secondary | ICD-10-CM | POA: Diagnosis present

## 2021-09-25 MED ORDER — IOHEXOL 300 MG/ML  SOLN
100.0000 mL | Freq: Once | INTRAMUSCULAR | Status: AC | PRN
  Administered 2021-09-25: 100 mL via INTRAVENOUS

## 2022-09-06 IMAGING — CR DG ABDOMEN 1V
1 series · 1 of 1 positions shown · non-contrast
Comparison: None.

CLINICAL DATA: Left flank pain with hematuria

EXAM:
ABDOMEN - 1 VIEW

[abdomen kub]
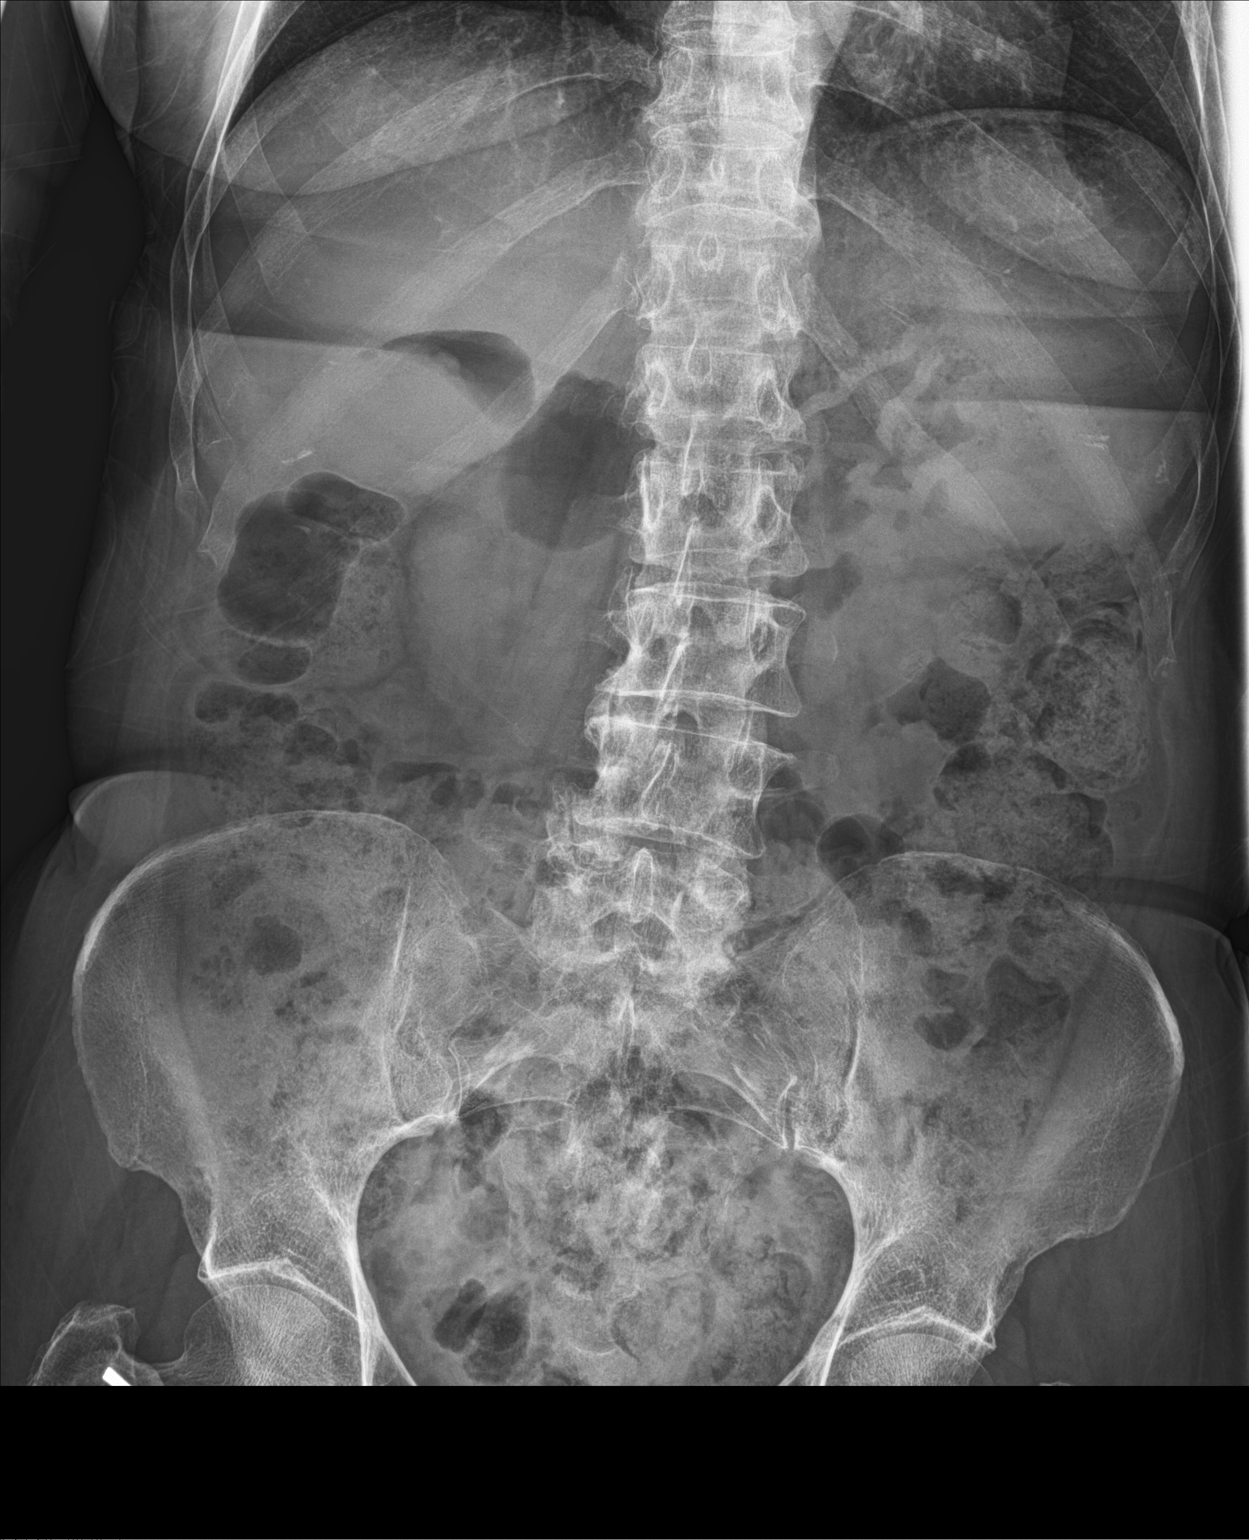

[1 of 1 positions shown; findings below may reference images not displayed]

FINDINGS: Nonobstructed gas pattern with large amount of stool in the colon.
No radiopaque calculi over the kidneys.
IMPRESSION: Negative.  Large amount of stool in the colon.
